# Patient Record
Sex: Female | Born: 1965 | Hispanic: Yes | Marital: Married | Smoking: Former smoker
Health system: Southern US, Community
[De-identification: ages and names within clinical notes are randomized; demographics above are authoritative.]

## PROBLEM LIST (undated history)

## (undated) DIAGNOSIS — K5790 Diverticulosis of intestine, part unspecified, without perforation or abscess without bleeding: Secondary | ICD-10-CM

## (undated) DIAGNOSIS — K219 Gastro-esophageal reflux disease without esophagitis: Secondary | ICD-10-CM

## (undated) DIAGNOSIS — J45909 Unspecified asthma, uncomplicated: Secondary | ICD-10-CM

## (undated) DIAGNOSIS — R945 Abnormal results of liver function studies: Secondary | ICD-10-CM

## (undated) DIAGNOSIS — R7989 Other specified abnormal findings of blood chemistry: Secondary | ICD-10-CM

## (undated) DIAGNOSIS — K649 Unspecified hemorrhoids: Secondary | ICD-10-CM

## (undated) DIAGNOSIS — F419 Anxiety disorder, unspecified: Secondary | ICD-10-CM

## (undated) DIAGNOSIS — N750 Cyst of Bartholin's gland: Secondary | ICD-10-CM

## (undated) DIAGNOSIS — E785 Hyperlipidemia, unspecified: Secondary | ICD-10-CM

## (undated) DIAGNOSIS — K635 Polyp of colon: Secondary | ICD-10-CM

## (undated) DIAGNOSIS — F988 Other specified behavioral and emotional disorders with onset usually occurring in childhood and adolescence: Secondary | ICD-10-CM

## (undated) DIAGNOSIS — I8390 Asymptomatic varicose veins of unspecified lower extremity: Secondary | ICD-10-CM

## (undated) DIAGNOSIS — N951 Menopausal and female climacteric states: Secondary | ICD-10-CM

## (undated) DIAGNOSIS — J302 Other seasonal allergic rhinitis: Secondary | ICD-10-CM

## (undated) DIAGNOSIS — E039 Hypothyroidism, unspecified: Secondary | ICD-10-CM

## (undated) DIAGNOSIS — J189 Pneumonia, unspecified organism: Secondary | ICD-10-CM

## (undated) HISTORY — DX: Unspecified hemorrhoids: K64.9

## (undated) HISTORY — DX: Other specified behavioral and emotional disorders with onset usually occurring in childhood and adolescence: F98.8

## (undated) HISTORY — PX: PELVIC LAPAROSCOPY: SHX162

## (undated) HISTORY — DX: Abnormal results of liver function studies: R94.5

## (undated) HISTORY — DX: Menopausal and female climacteric states: N95.1

## (undated) HISTORY — DX: Asymptomatic varicose veins of unspecified lower extremity: I83.90

## (undated) HISTORY — DX: Other specified abnormal findings of blood chemistry: R79.89

## (undated) HISTORY — DX: Hyperlipidemia, unspecified: E78.5

## (undated) HISTORY — DX: Hypothyroidism, unspecified: E03.9

## (undated) HISTORY — PX: OVARIAN CYST REMOVAL: SHX89

## (undated) HISTORY — DX: Polyp of colon: K63.5

## (undated) HISTORY — DX: Unspecified asthma, uncomplicated: J45.909

## (undated) HISTORY — DX: Diverticulosis of intestine, part unspecified, without perforation or abscess without bleeding: K57.90

## (undated) HISTORY — DX: Gastro-esophageal reflux disease without esophagitis: K21.9

## (undated) HISTORY — DX: Anxiety disorder, unspecified: F41.9

## (undated) HISTORY — DX: Cyst of Bartholin's gland: N75.0

---

## 1991-11-01 HISTORY — PX: VEIN SURGERY: SHX48

## 1993-10-31 HISTORY — PX: CHOLECYSTECTOMY: SHX55

## 1999-01-29 ENCOUNTER — Ambulatory Visit (HOSPITAL_COMMUNITY): Admission: RE | Admit: 1999-01-29 | Discharge: 1999-01-29 | Payer: Self-pay | Admitting: Internal Medicine

## 1999-01-29 ENCOUNTER — Encounter: Payer: Self-pay | Admitting: Internal Medicine

## 1999-02-08 ENCOUNTER — Other Ambulatory Visit: Admission: RE | Admit: 1999-02-08 | Discharge: 1999-02-08 | Payer: Self-pay | Admitting: *Deleted

## 1999-02-25 ENCOUNTER — Encounter: Payer: Self-pay | Admitting: Internal Medicine

## 1999-02-25 ENCOUNTER — Ambulatory Visit (HOSPITAL_COMMUNITY): Admission: RE | Admit: 1999-02-25 | Discharge: 1999-02-25 | Payer: Self-pay | Admitting: Internal Medicine

## 1999-12-31 ENCOUNTER — Encounter: Payer: Self-pay | Admitting: *Deleted

## 1999-12-31 ENCOUNTER — Encounter: Admission: RE | Admit: 1999-12-31 | Discharge: 1999-12-31 | Payer: Self-pay | Admitting: *Deleted

## 2000-02-08 ENCOUNTER — Other Ambulatory Visit: Admission: RE | Admit: 2000-02-08 | Discharge: 2000-02-08 | Payer: Self-pay | Admitting: *Deleted

## 2000-04-27 ENCOUNTER — Emergency Department (HOSPITAL_COMMUNITY): Admission: EM | Admit: 2000-04-27 | Discharge: 2000-04-27 | Payer: Self-pay | Admitting: Internal Medicine

## 2000-12-08 ENCOUNTER — Encounter (INDEPENDENT_AMBULATORY_CARE_PROVIDER_SITE_OTHER): Payer: Self-pay

## 2000-12-08 ENCOUNTER — Other Ambulatory Visit: Admission: RE | Admit: 2000-12-08 | Discharge: 2000-12-08 | Payer: Self-pay | Admitting: *Deleted

## 2001-06-11 ENCOUNTER — Other Ambulatory Visit: Admission: RE | Admit: 2001-06-11 | Discharge: 2001-06-11 | Payer: Self-pay | Admitting: *Deleted

## 2002-03-11 ENCOUNTER — Other Ambulatory Visit: Admission: RE | Admit: 2002-03-11 | Discharge: 2002-03-11 | Payer: Self-pay | Admitting: *Deleted

## 2003-04-29 ENCOUNTER — Other Ambulatory Visit: Admission: RE | Admit: 2003-04-29 | Discharge: 2003-04-29 | Payer: Self-pay | Admitting: *Deleted

## 2004-10-11 ENCOUNTER — Other Ambulatory Visit: Admission: RE | Admit: 2004-10-11 | Discharge: 2004-10-11 | Payer: Self-pay | Admitting: *Deleted

## 2006-03-08 ENCOUNTER — Other Ambulatory Visit: Admission: RE | Admit: 2006-03-08 | Discharge: 2006-03-08 | Payer: Self-pay | Admitting: *Deleted

## 2007-06-22 ENCOUNTER — Other Ambulatory Visit: Admission: RE | Admit: 2007-06-22 | Discharge: 2007-06-22 | Payer: Self-pay | Admitting: *Deleted

## 2008-07-24 ENCOUNTER — Ambulatory Visit: Payer: Self-pay | Admitting: Gynecology

## 2008-07-28 ENCOUNTER — Ambulatory Visit: Payer: Self-pay | Admitting: Gynecology

## 2009-03-03 ENCOUNTER — Other Ambulatory Visit: Admission: RE | Admit: 2009-03-03 | Discharge: 2009-03-03 | Payer: Self-pay | Admitting: Gynecology

## 2009-03-03 ENCOUNTER — Ambulatory Visit: Payer: Self-pay | Admitting: Gynecology

## 2009-03-03 ENCOUNTER — Encounter: Payer: Self-pay | Admitting: Gynecology

## 2009-03-10 ENCOUNTER — Ambulatory Visit: Payer: Self-pay | Admitting: Gynecology

## 2009-04-14 ENCOUNTER — Ambulatory Visit: Payer: Self-pay | Admitting: Gynecology

## 2009-04-17 ENCOUNTER — Ambulatory Visit: Payer: Self-pay | Admitting: Gynecology

## 2009-05-05 ENCOUNTER — Ambulatory Visit: Payer: Self-pay | Admitting: Gynecology

## 2009-10-21 ENCOUNTER — Encounter: Admission: RE | Admit: 2009-10-21 | Discharge: 2009-10-21 | Payer: Self-pay | Admitting: Gynecology

## 2010-08-20 ENCOUNTER — Ambulatory Visit: Payer: Self-pay | Admitting: Gynecology

## 2010-08-20 ENCOUNTER — Other Ambulatory Visit: Admission: RE | Admit: 2010-08-20 | Discharge: 2010-08-20 | Payer: Self-pay | Admitting: Gynecology

## 2010-08-24 ENCOUNTER — Ambulatory Visit: Payer: Self-pay | Admitting: Gynecology

## 2010-11-20 ENCOUNTER — Other Ambulatory Visit: Payer: Self-pay | Admitting: Gynecology

## 2010-11-20 DIAGNOSIS — Z0000 Encounter for general adult medical examination without abnormal findings: Secondary | ICD-10-CM

## 2010-12-02 ENCOUNTER — Ambulatory Visit
Admission: RE | Admit: 2010-12-02 | Discharge: 2010-12-02 | Disposition: A | Discharge status: Home / Self Care | Payer: BC Managed Care – PPO | Source: Ambulatory Visit | Attending: Gynecology | Admitting: Gynecology

## 2010-12-02 DIAGNOSIS — Z0000 Encounter for general adult medical examination without abnormal findings: Secondary | ICD-10-CM

## 2010-12-29 ENCOUNTER — Ambulatory Visit (INDEPENDENT_AMBULATORY_CARE_PROVIDER_SITE_OTHER): Payer: BC Managed Care – PPO | Admitting: Gynecology

## 2010-12-29 DIAGNOSIS — N750 Cyst of Bartholin's gland: Secondary | ICD-10-CM

## 2011-01-05 ENCOUNTER — Ambulatory Visit: Payer: BC Managed Care – PPO | Admitting: Gynecology

## 2011-01-07 ENCOUNTER — Ambulatory Visit: Payer: BC Managed Care – PPO | Admitting: Gynecology

## 2011-01-19 ENCOUNTER — Ambulatory Visit: Payer: BC Managed Care – PPO | Admitting: Gynecology

## 2011-01-26 ENCOUNTER — Ambulatory Visit (INDEPENDENT_AMBULATORY_CARE_PROVIDER_SITE_OTHER): Payer: BC Managed Care – PPO | Admitting: Gynecology

## 2011-01-26 DIAGNOSIS — N750 Cyst of Bartholin's gland: Secondary | ICD-10-CM

## 2011-01-26 DIAGNOSIS — N920 Excessive and frequent menstruation with regular cycle: Secondary | ICD-10-CM

## 2011-01-26 DIAGNOSIS — E7800 Pure hypercholesterolemia, unspecified: Secondary | ICD-10-CM

## 2012-06-06 ENCOUNTER — Other Ambulatory Visit: Payer: Self-pay | Admitting: Gynecology

## 2012-06-06 DIAGNOSIS — Z1231 Encounter for screening mammogram for malignant neoplasm of breast: Secondary | ICD-10-CM

## 2012-06-19 ENCOUNTER — Ambulatory Visit
Admission: RE | Admit: 2012-06-19 | Discharge: 2012-06-19 | Disposition: A | Discharge status: Home / Self Care | Payer: BC Managed Care – PPO | Source: Ambulatory Visit | Attending: Gynecology | Admitting: Gynecology

## 2012-06-19 DIAGNOSIS — Z1231 Encounter for screening mammogram for malignant neoplasm of breast: Secondary | ICD-10-CM

## 2013-12-09 ENCOUNTER — Encounter: Payer: Self-pay | Admitting: Gastroenterology

## 2014-01-07 ENCOUNTER — Encounter: Payer: BC Managed Care – PPO | Admitting: Gastroenterology

## 2014-01-30 ENCOUNTER — Encounter: Payer: Self-pay | Admitting: Family

## 2015-02-27 ENCOUNTER — Ambulatory Visit (INDEPENDENT_AMBULATORY_CARE_PROVIDER_SITE_OTHER): Payer: BLUE CROSS/BLUE SHIELD | Admitting: Gynecology

## 2015-02-27 ENCOUNTER — Encounter: Payer: Self-pay | Admitting: Gynecology

## 2015-02-27 ENCOUNTER — Other Ambulatory Visit (HOSPITAL_COMMUNITY)
Admission: RE | Admit: 2015-02-27 | Discharge: 2015-02-27 | Disposition: A | Discharge status: Home / Self Care | Payer: BLUE CROSS/BLUE SHIELD | Source: Ambulatory Visit | Attending: Gynecology | Admitting: Gynecology

## 2015-02-27 VITALS — BP 126/78 | Ht 65.0 in | Wt 156.0 lb

## 2015-02-27 DIAGNOSIS — Z01419 Encounter for gynecological examination (general) (routine) without abnormal findings: Secondary | ICD-10-CM | POA: Insufficient documentation

## 2015-02-27 DIAGNOSIS — Z1151 Encounter for screening for human papillomavirus (HPV): Secondary | ICD-10-CM | POA: Diagnosis present

## 2015-02-27 DIAGNOSIS — N750 Cyst of Bartholin's gland: Secondary | ICD-10-CM | POA: Diagnosis not present

## 2015-02-27 DIAGNOSIS — Z1159 Encounter for screening for other viral diseases: Secondary | ICD-10-CM

## 2015-02-27 DIAGNOSIS — N9089 Other specified noninflammatory disorders of vulva and perineum: Secondary | ICD-10-CM

## 2015-02-27 DIAGNOSIS — N951 Menopausal and female climacteric states: Secondary | ICD-10-CM | POA: Diagnosis not present

## 2015-02-27 LAB — CBC WITH DIFFERENTIAL/PLATELET
BASOS ABS: 0.1 10*3/uL (ref 0.0–0.1)
Basophils Relative: 1 % (ref 0–1)
Eosinophils Absolute: 0.8 10*3/uL — ABNORMAL HIGH (ref 0.0–0.7)
Eosinophils Relative: 10 % — ABNORMAL HIGH (ref 0–5)
HCT: 43.3 % (ref 36.0–46.0)
HEMOGLOBIN: 14.4 g/dL (ref 12.0–15.0)
Lymphocytes Relative: 19 % (ref 12–46)
Lymphs Abs: 1.5 10*3/uL (ref 0.7–4.0)
MCH: 30.4 pg (ref 26.0–34.0)
MCHC: 33.3 g/dL (ref 30.0–36.0)
MCV: 91.5 fL (ref 78.0–100.0)
MONOS PCT: 9 % (ref 3–12)
MPV: 9 fL (ref 8.6–12.4)
Monocytes Absolute: 0.7 10*3/uL (ref 0.1–1.0)
NEUTROS ABS: 4.8 10*3/uL (ref 1.7–7.7)
NEUTROS PCT: 61 % (ref 43–77)
PLATELETS: 310 10*3/uL (ref 150–400)
RBC: 4.73 MIL/uL (ref 3.87–5.11)
RDW: 14.3 % (ref 11.5–15.5)
WBC: 7.8 10*3/uL (ref 4.0–10.5)

## 2015-02-27 LAB — LIPID PANEL
CHOL/HDL RATIO: 4.4 ratio
Cholesterol: 274 mg/dL — ABNORMAL HIGH (ref 0–200)
HDL: 62 mg/dL (ref >=46)
LDL Cholesterol: 167 mg/dL — ABNORMAL HIGH (ref 0–99)
TRIGLYCERIDES: 223 mg/dL — AB (ref <150)
VLDL: 45 mg/dL — AB (ref 0–40)

## 2015-02-27 LAB — COMPREHENSIVE METABOLIC PANEL
ALBUMIN: 4.1 g/dL (ref 3.5–5.2)
ALT: 37 U/L — AB (ref 0–35)
AST: 33 U/L (ref 0–37)
Alkaline Phosphatase: 110 U/L (ref 39–117)
BUN: 10 mg/dL (ref 6–23)
CO2: 22 meq/L (ref 19–32)
Calcium: 9.8 mg/dL (ref 8.4–10.5)
Chloride: 106 mEq/L (ref 96–112)
Creat: 0.72 mg/dL (ref 0.50–1.10)
GLUCOSE: 81 mg/dL (ref 70–99)
Potassium: 4.9 mEq/L (ref 3.5–5.3)
SODIUM: 138 meq/L (ref 135–145)
Total Bilirubin: 0.5 mg/dL (ref 0.2–1.2)
Total Protein: 7.6 g/dL (ref 6.0–8.3)

## 2015-02-27 LAB — TSH: TSH: 6.409 u[IU]/mL — ABNORMAL HIGH (ref 0.350–4.500)

## 2015-02-27 NOTE — Progress Notes (Signed)
Denise Benitez [birth year] [id number]   History:    49 y.o.  for annual gyn exam who is new to the practice. Patient has not had a gynecological exam in over 5 years. Patient complaining of a bulging sensation in her vagina. Patient reports many years ago she had an incision and drainage of a right Bartholin duct cyst. Patient's history also indicated that many years ago she had laparoscopic ovarian cystectomy and she reports the pathology report was benign. She also has had previous tubal sterilization procedure. She is reporting now that her cycles are skipping and is accompanied by hot flashes, irritability, mood swing and insomnia. Patient denied any prior history of abnormal Pap smear.  Past medical history,surgical history, family history and social history were all reviewed and documented in the EPIC chart.  Gynecologic History Patient's last menstrual period was 01/23/2015. Contraception: tubal ligation Last Pap: Over 5 years ago. Results were: normal Last mammogram: 2013. Results were: Normal but dense  Obstetric History OB History  Gravida Para Term Preterm AB SAB TAB Ectopic Multiple Living  2 2        2     # Outcome Date GA Lbr Len/2nd Weight Sex Delivery Anes PTL Lv  2 Para           1 Para                ROS: A ROS was performed and pertinent positives and negatives are included in the history.  GENERAL: No fevers or chills. HEENT: No change in vision, no earache, sore throat or sinus congestion. NECK: No pain or stiffness. CARDIOVASCULAR: No chest pain or pressure. No palpitations. PULMONARY: No shortness of breath, cough or wheeze. GASTROINTESTINAL: No abdominal pain, nausea, vomiting or diarrhea, melena or bright red blood per rectum. GENITOURINARY: Bulging sensation right side of vagina. MUSCULOSKELETAL: No joint or muscle pain, no back pain, no recent trauma. DERMATOLOGIC: No rash, no itching, no lesions. ENDOCRINE: No polyuria, polydipsia, no heat or cold  intolerance. No recent change in weight. HEMATOLOGICAL: No anemia or easy bruising or bleeding. NEUROLOGIC: No headache, seizures, numbness, tingling or weakness. PSYCHIATRIC: No depression, no loss of interest in normal activity or change in sleep pattern.     Exam: chaperone present  BP 126/78 mmHg  Ht 5\' 5"  (1.651 m)  Wt 156 lb (70.761 kg)  BMI 25.96 kg/m2  LMP 01/23/2015  Body mass index is 25.96 kg/(m^2).  General appearance : Well developed well nourished female. No acute distress HEENT: Eyes: no retinal hemorrhage or exudates,  Neck supple, trachea midline, no carotid bruits, no thyroidmegaly Lungs: Clear to auscultation, no rhonchi or wheezes, or rib retractions  Heart: Regular rate and rhythm, no murmurs or gallops Breast:Examined in sitting and supine position were symmetrical in appearance, no palpable masses or tenderness,  no skin retraction, no nipple inversion, no nipple discharge, no skin discoloration, no axillary or supraclavicular lymphadenopathy Abdomen: no palpable masses or tenderness, no rebound or guarding Extremities: no edema or skin discoloration or tenderness  Pelvic:  Bartholin, Urethra, Skene Glands: Within normal limits             Vagina: Right Bartholin cyst  Cervix: No gross lesions or discharge  Uterus  anteverted, normal size, shape and consistency, non-tender and mobile  Adnexa  Without masses or tenderness  Anus and perineum  normal   Rectovaginal  normal sphincter tone without palpated masses or tenderness  Hemoccult not indicated     Assessment/Plan:  49 y.o. female for annual exam who appears to be perimenopausal and for this reason we will check her [facility] along with the following screening labs in the fasting state: Fasting lipid profile, comprehensive metabolic panel, TSH, CBC, and urinalysis. Pap smear with HPV screening was done today. Patient will return back to the office next week to discuss the results as well as plan of  incision and drainage of Bartholin duct cyst. Meanwhile she will do daily sitz baths. She was provided with a requisition to schedule her mammogram and to request three-dimensional due to the fact that her last mammogram in 2013 had described that her breasts were dense. We also discussed importance of calcium and vitamin D and regular exercise for osteoporosis prevention. Patient also will be reminded that she needs a screening colonoscopy.  New CDC guidelines is recommending patients be tested once in her lifetime for hepatitis C antibody who were born between [birth year] through 1967. This was discussed with the patient today and has agreed to be tested today.   Terrance Mass MD, 10:39 AM 02/27/2015

## 2015-02-27 NOTE — Patient Instructions (Addendum)
Terapia de reemplazo hormonal (Hormone Replacement Therapy) En la menopausia, su cuerpo comienza a producir menos estrgeno y Immunologist. Esto provoca que el cuerpo deje de tener perodos [place]. Esto se debe a que el estrgeno y la progesterona controlan sus perodos y su ciclo menstrual. Denise Benitez falta de estrgeno puede causar sntomas tales como:  Social research officer, government.  Sequedad vaginal  Piel seca.  Prdida del deseo sexual.  Riesgo de prdida de hueso (osteoporosis). Cuando esto ocurre, puede elegir realizar Denise Benitez terapia hormonal para volver a Clinical research associate estrgeno perdido [employer]. Cuando slo se introduce esta hormona, el procedimiento se conoce normalmente como TRE (terapia de reemplazo de [place]). Cuando la hormona progestina se combina con el estrgeno, el procedimiento se conoce normalmente como TH (terapia hormonal). Esto es lo que previamente se conoca como terapia de reemplazo hormonal (TRH). El profesional que le asiste le ayudar a tomar una decisin acerca de lo que resulte lo mejor para usted. La decisin de realizar una TRH cambia a menudo debido a que se Risk manager. Muchos estudios no ponen de acuerdo con respecto a los beneficios de Optometrist una terapia de reemplazo hormonal.  BENEFICIOS PROBABLES DE LA TRH QUE INCLUYEN PROTECCIN CONTRA:  Golpes de calor - Un golpe de calor es la sensacin repentina de calor sobre la cara y el cuerpo. La piel enrojece, como al sonrojarse. Estn asociados con la transpiracin y los trastornos del sueo. Las mujeres que atraviesan la menopausia pueden tener golpes de calor unas pocas veces en el mes o varios al da; esto depende de la mujer.  Osteoporosis (prdida de hueso) - El estrgeno ayuda a protegerse contra la prdida de [place]. Luego de la [place], los huesos de una mujer pierden calcio y se vuelven frgiles y Publishing rights manager. Como resultado, es ms probable que el hueso se [place]. Los que resultan afectados con  mayor frecuencia son los de la cadera, la [place] y la columna vertebral. La terapia hormonal puede ayudar a retardar la prdida de hueso luego de la menopausia. Realizar ejercicios con peso y tomar calcio con vitamina D tambin puede ayudar a prevenir la prdida de [place]. Existen medicamentos que puede prescribir el profesional que la asiste para ayudar a prevenir la osteoporosis.  Sequedad vaginal - La prdida de estrgeno produce cambios en la vagina. El recubrimiento de la misma puede volverse fino y Education officer, museum. Estos cambios pueden causar dolor y [place]. La sequedad tambin puede producir una infeccin. Puede ocasionarle ardor y [place].  Las infecciones en las vas urinarias son ms comunes luego de la menopausia debido a la falta de Oceanographer.  Otros beneficios posibles del estrgeno incluyen un cambio positivo en el humor y en la memoria de corto plazo en las mujeres. EFECTOS SECUNDARIOS Y RIESGOS  Utilizar estrgeno slo sin progesterona causa que el recubrimiento del tero crezca. Esto aumenta el riesgo de cncer endometrial. El profesional que la asiste deber darle otra hormona llamada progestina, si usted tiene tero.  Las mujeres que realizan una TH combinada (estrgeno y progestina) parecen tener un mayor riesgo de sufrir cncer de mama. El riesgo parece ser [place], [place] aumenta a lo largo del tiempo que se realice la [place].  La terapia combinada tambin hace que el tejido mamario sea levemente ms denso, lo que hace que sea ms difcil leer mamografas (radiografas de mama).  Combinada, la terapia de estrgeno y Immunologist puede realizarse todos los das, en cuyo caso podrn Warehouse manager de [place]. La TH puede realizarse  de manera cclica, en cuyo caso tendr perodos menstruales.  La TH puede aumentar el riesgo de [place], ataque cardaco, cncer de mama y formacin de cogulos en la pierna. TRATAMIENTO  Si decide realizar Denise Benitez TH y tiene tero,  normalmente se prescribe el uso de estrgeno y progestina.  El profesional que la asiste le ayudar a decidir la mejor forma de [employer].  Lo mejor es Chief Executive Officer dosis posible que pueda ayudarla con sus sntomas y tomarlos durante la menor cantidad de tiempo posible.  La terapia hormonal puede ayudar a Banker de los problemas (sntomas) que afectan a las mujeres durante la menopausia. Antes de tomar una decisin con respecto a la TH, converse con el profesional que la asiste acerca de qu es lo mejor para usted. Mantngase bien informada y sintase cmoda con sus decisiones. INSTRUCCIONES PARA EL CUIDADO DOMICILIARIO:  [place] indicaciones del profesional con respecto a cmo Biomedical scientist.  Hgase controles de [place] regular, e incluya [place] y [place]. SOLICITE ATENCIN MDICA DE INMEDIATO SI PRESENTA:  Hemorragia vaginal anormal.  Dolor o inflamacin en las piernas, falta de aliento o Tourist information centre manager.  Mareos o dolores de [place].  Protuberancias o cambios en sus mamas o axilas.  Pronunciacin inarticulada.  Debilidad o adormecimiento en los brazos o las piernas.  Dolor, ardor o sangrado al [employer].  Dolor abdominal. Document Released: 04/04/2008 Document Revised: 01/09/2012 ExitCare Patient Information 2015 [place], [place]. This information is not intended to replace advice given to you by your health care provider. Make sure you discuss any questions you have with your health care provider. Perimenopausia (Perimenopause) La perimenopausia es el momento en que su cuerpo comienza a pasar a la menopausia (sin menstruacin durante 12 meses consecutivos). Es un proceso natural. La perimenopausia puede comenzar entre 2 y 90 aos antes de la menopausia y por lo general tiene una duracin de 1 ao ms pasada la menopausia. [employer], los ovarios podran producir un vulo o no. Los ovarios varan su produccin de las hormonas  estrgeno y Technical brewer. Esto puede causar perodos menstruales irregulares, dificultad para quedar embarazada, hemorragia vaginal entre perodos y sntomas incmodos. CAUSAS  Produccin irregular de las hormonas ovricas estrgeno y Immunologist, y no ovular todos los meses.  Otras causas son:  Tumor de la glndula pituitaria.  Enfermedades que [employer] ovarios.  Radioterapia.  Quimioterapia.  Causas desconocidas.  Fumar mucho y abusar del consumo de alcohol puede llevar a que la perimenopausia aparezca antes. SIGNOS Y SNTOMAS   Acaloramiento.  Sudoracin nocturna.  Perodos menstruales irregulares.  Disminucin del deseo sexual.  Sequedad vaginal.  Dolores de cabeza.  Cambios en el estado de nimo.  Depresin.  Problemas de memoria.  Irritabilidad.  Cansancio.  Aumento de [place].  Problemas para quedar embarazada.  Prdida de clulas seas (osteoporosis).  Comienzo de endurecimiento de las arterias (aterosclerosis). DIAGNSTICO  El mdico realizar un diagnstico en funcin de su edad, historial de perodos menstruales y sntomas. Le realizarn un examen fsico para ver si hay algn cambio en su cuerpo, en especial en sus rganos reproductores. Las pruebas hormonales pueden ser o no tiles segn la cantidad de hormonas femeninas que produzca y [employer] produzca. Sin embargo, podrn Microbiologist pruebas hormonales para Statistician. TRATAMIENTO  En algunos casos, no se necesita tratamiento. La decisin acerca de qu tratamiento es necesario durante la perimenopausia deber realizarse en conjunto con su mdico segn cmo estn afectando los sntomas  a su estilo de vida. Existen varios tratamientos disponibles, como:  Risk manager cada sntoma individual con medicamentos especficos para ese sntoma.  Algunos medicamentos herbales pueden ayudar en sntomas especficos.  Psicoterapia.  Terapia grupal. INSTRUCCIONES PARA EL CUIDADO EN  EL HOGAR   Controle sus periodos menstruales (cundo ocurren, qu tan abundantes son, cunto tiempo pasa entre perodos, y cunto duran) como tambin sus sntomas y cundo comenzaron.  Tome slo medicamentos de venta libre o recetados, segn las indicaciones del mdico.  Duerma y descanse.  Haga actividad fsica.  Consuma una dieta que contenga calcio (bueno para los [place]) y productos derivados de la soja (actan como estrgenos).  No fume.  Evite las bebidas alcohlicas.  Tome los suplementos vitamnicos segn las indicaciones del mdico. En ciertos casos, puede ser de [place] tomar vitamina E.  Tome suplementos de calcio y vitamina D para ayudar a Publishing rights manager prdida sea.  En algunos casos la terapia de grupo podr ayudarla.  La acupuntura puede ser de ayuda en ciertos casos. SOLICITE ATENCIN MDICA SI:   Tiene preguntas acerca de sus sntomas.  Necesita ser derivada a un especialista (gineclogo, psiquiatra, o psiclogo). SOLICITE ATENCIN MDICA DE INMEDIATO SI:   Sufre una hemorragia vaginal abundante.  Su perodo menstrual dura ms de 8 das.  Sus perodos son recurrentes cada menos de [address]  Tiene hemorragias durante las [employer].  Est muy deprimido.  Siente dolor al [employer].  Siente dolor de cabeza intenso.  Tiene problemas de visin. Document Released: 10/17/2005 Document Revised: 08/07/2013 [facility] Patient Information 2015 [place], [place]. This information is not intended to replace advice given to you by your health care provider. Make sure you discuss any questions you have with your health care provider. Absceso o quiste de Bartolino (Bartholin's Cyst or Abscess) Las glndulas de Bartolino son glndulas pequeas ubicadas dentro de los pliegues de la piel (labios) a los lados de la apertura de la vagina (canal del parto). Cuando el conducto de la glndula se [place], puede desarrollarse un quiste. Cuando esto ocurre, el lquido que se  acumula dentro del quiste puede llegar a infectarse. Esto se conoce como absceso. La glndula de Bartolino produce una mucosidad lquida en la parte externa de la vagina durante las relaciones sexuales. SNTOMAS  Los [employer] presentan un quiste pequeo no tienen problemas.  Podr sentir desde una leve molestia a un dolor intenso segn el tamao del quiste y si existe infeccin o no,  Education officer, environmental, inflamacin e hinchazn en la zona inferior de la vagina.  Dolor en [place].  Presin en las zona del perineo.  Hinchazn de los labios de la vagina.  El quiste puede estar en uno o ambos lados de la vagina. DIAGNSTICO  El profesional podr observar una gran zona hinchada en la parte inferior de la vagina.  Es una zona dolorosa al tacto.  Si se trata de un absceso habr inflamacin y Social research officer, government. TRATAMIENTO  En algunos casos el quiste desaparecer sin tratamiento.  Aplique compresas tibias hmedas en la zona o tome baos de asiento varias veces al da.  Le practicarn una incisin para drenar el quiste o el absceso, previa aplicacin de anestesia local.  Si se trata de un absceso le indicarn un cultivo del pus.  Y en ese caso le prescribirn un tratamiento con antibiticos.  Se realizar una abertura en la glndula, suturando los bordes para hacer la abertura ms grande ([place]).  Si aparece nuevamente el quiste o el absceso, le extirparn  toda la glndula. PREVENCIN  Mantenga una buena higiene.  Higienice la zona vaginal con jabn neutro y un pao suave.  No frote la zona al darse un bao.  Proteja la zona de la entrepierna con un apsito si realiza largos paseos en bicicleta o a caballo.  Asegrese de estar bien lubricada cuando mantenga relaciones sexuales. INSTRUCCIONES PARA EL CUIDADO DOMICILIARIO  Si su quiste o absceso ha sido abierto, pudieran haberle colocado un pequeo trozo de gasa o un drenaje para permitir que la herida supure.  La gasa o el drenaje a menos que se lo indique el profesional que le asiste.  Use toallas femeninas y no tampones cuando lo necesite en caso de drenaje o sangrado.  Si le han recetado medicamentos que combaten los grmenes (antibiticos ), tmelos exactamente de la manera que le haya sido indicada. Asegrese de terminar con todo el ciclo de antibiticos.  Utilice los medicamentos de venta libre o de prescripcin para Conservation officer, historic buildings, Health and safety inspector o la [place], segn se lo indique el profesional que lo asiste. SOLICITE ATENCIN MDICA DE INMEDIATO SI:  Aumenta el dolor, el enrojecimiento, la hinchazn o la supuracin.  La herida ha sangrado al punto que ha debido usar ms apsitos de los que la cantidad de apsitos sugerida por el mdico en 24 horas.  Siente escalofros.  Tiene fiebre.  Tiene algn problema (sntoma) nuevo o se agravan lo ya existentes. EST SEGURO QUE:  Comprende las instrucciones para el alta mdica.  Controlar su enfermedad.  Solicitar atencin mdica de inmediato segn las indicaciones. Document Released: 10/17/2005 Document Revised: 01/09/2012 [facility] Patient Information 2015 [place]. This information is not intended to replace advice given to you by your health care provider. Make sure you discuss any questions you have with your health care provider.

## 2015-02-27 NOTE — Addendum Note (Signed)
Addended by: Nelva Nay on: 02/27/2015 10:59 AM   Modules accepted: Orders

## 2015-02-28 LAB — URINALYSIS W MICROSCOPIC + REFLEX CULTURE
Bacteria, UA: NONE SEEN
Crystals: NONE SEEN
GLUCOSE, UA: NEGATIVE mg/dL
Hgb urine dipstick: NEGATIVE
LEUKOCYTES UA: NEGATIVE
Nitrite: NEGATIVE
PROTEIN: 30 mg/dL — AB
Specific Gravity, Urine: 1.022 (ref 1.005–1.030)
Urobilinogen, UA: 0.2 mg/dL (ref 0.0–1.0)
pH: 5.5 (ref 5.0–8.0)

## 2015-02-28 LAB — HEPATITIS C ANTIBODY: HCV Ab: NEGATIVE

## 2015-02-28 LAB — FOLLICLE STIMULATING HORMONE: FSH: 85.9 m[IU]/mL

## 2015-03-02 ENCOUNTER — Encounter: Payer: Self-pay | Admitting: Gynecology

## 2015-03-02 LAB — CYTOLOGY - PAP

## 2015-03-03 ENCOUNTER — Other Ambulatory Visit: Payer: Self-pay

## 2015-03-03 ENCOUNTER — Telehealth: Payer: Self-pay | Admitting: *Deleted

## 2015-03-03 ENCOUNTER — Ambulatory Visit (INDEPENDENT_AMBULATORY_CARE_PROVIDER_SITE_OTHER): Payer: BLUE CROSS/BLUE SHIELD | Admitting: Gynecology

## 2015-03-03 ENCOUNTER — Encounter: Payer: Self-pay | Admitting: Gynecology

## 2015-03-03 VITALS — BP 124/78

## 2015-03-03 DIAGNOSIS — D721 Eosinophilia, unspecified: Secondary | ICD-10-CM

## 2015-03-03 DIAGNOSIS — E785 Hyperlipidemia, unspecified: Secondary | ICD-10-CM | POA: Diagnosis not present

## 2015-03-03 DIAGNOSIS — L720 Epidermal cyst: Secondary | ICD-10-CM

## 2015-03-03 DIAGNOSIS — E039 Hypothyroidism, unspecified: Secondary | ICD-10-CM | POA: Diagnosis not present

## 2015-03-03 DIAGNOSIS — N750 Cyst of Bartholin's gland: Secondary | ICD-10-CM | POA: Diagnosis not present

## 2015-03-03 DIAGNOSIS — Z780 Asymptomatic menopausal state: Secondary | ICD-10-CM

## 2015-03-03 DIAGNOSIS — R945 Abnormal results of liver function studies: Secondary | ICD-10-CM

## 2015-03-03 DIAGNOSIS — R7989 Other specified abnormal findings of blood chemistry: Secondary | ICD-10-CM

## 2015-03-03 DIAGNOSIS — Z1231 Encounter for screening mammogram for malignant neoplasm of breast: Secondary | ICD-10-CM

## 2015-03-03 LAB — CBC WITH DIFFERENTIAL/PLATELET
Basophils Absolute: 0.2 10*3/uL — ABNORMAL HIGH (ref 0.0–0.1)
Basophils Relative: 2 % — ABNORMAL HIGH (ref 0–1)
EOS ABS: 0.7 10*3/uL (ref 0.0–0.7)
Eosinophils Relative: 9 % — ABNORMAL HIGH (ref 0–5)
HEMATOCRIT: 42.1 % (ref 36.0–46.0)
Hemoglobin: 14 g/dL (ref 12.0–15.0)
LYMPHS ABS: 1.7 10*3/uL (ref 0.7–4.0)
Lymphocytes Relative: 21 % (ref 12–46)
MCH: 30.4 pg (ref 26.0–34.0)
MCHC: 33.3 g/dL (ref 30.0–36.0)
MCV: 91.5 fL (ref 78.0–100.0)
MPV: 9.4 fL (ref 8.6–12.4)
Monocytes Absolute: 0.6 10*3/uL (ref 0.1–1.0)
Monocytes Relative: 8 % (ref 3–12)
NEUTROS PCT: 60 % (ref 43–77)
Neutro Abs: 4.7 10*3/uL (ref 1.7–7.7)
Platelets: 258 10*3/uL (ref 150–400)
RBC: 4.6 MIL/uL (ref 3.87–5.11)
RDW: 14.5 % (ref 11.5–15.5)
WBC: 7.9 10*3/uL (ref 4.0–10.5)

## 2015-03-03 LAB — THYROID PANEL WITH TSH
FREE THYROXINE INDEX: 1.4 (ref 1.4–3.8)
T3 Uptake: 30 % (ref 22–35)
T4, Total: 4.7 ug/dL (ref 4.5–12.0)
TSH: 5.44 u[IU]/mL — ABNORMAL HIGH (ref 0.350–4.500)

## 2015-03-03 LAB — AST: AST: 30 U/L (ref 0–37)

## 2015-03-03 LAB — ALT: ALT: 39 U/L — AB (ref 0–35)

## 2015-03-03 MED ORDER — ZOLPIDEM TARTRATE 10 MG PO TABS
10.0000 mg | ORAL_TABLET | Freq: Every evening | ORAL | Status: DC | PRN
Start: 2015-03-03 — End: 2015-07-21

## 2015-03-03 MED ORDER — KETOROLAC TROMETHAMINE 10 MG PO TABS
10.0000 mg | ORAL_TABLET | Freq: Four times a day (QID) | ORAL | Status: DC | PRN
Start: 2015-03-03 — End: 2015-07-08

## 2015-03-03 MED ORDER — IBUPROFEN 800 MG PO TABS: 800.0000 mg | ORAL_TABLET | Freq: Three times a day (TID) | ORAL | Status: DC

## 2015-03-03 NOTE — Telephone Encounter (Signed)
Call in Ultram 50 mg one by mouth every 6 hours when necessary #30

## 2015-03-03 NOTE — Progress Notes (Addendum)
   Patient presented to the office today for incision and drainage of a Bartholin duct abscess. Patient had also complained of a mons pubic lesion which appears to be an epidermal inclusion cyst. Patient many years ago had a Bartholin duct cyst on the same side. Patient was in the office within the past week for her annual exam and had blood work drawn which we discussed today as well along with some were abnormal as follows:  Her TSH was found to be elevated at 6.409 [facility] was found to be elevated 85.9 Her lipid profile: Total cholesterol 274, triglycerides elevated to 223 LDL elevated at 167 CBC: Slightly elevated eosinophil Elevated SGPT   Exam: Right Bartholin duct inflammation/abscess Mons pubis abdominal inclusion cyst  Procedure: The area of the right Bartholin gland was cleansed with Betadine solution and 1% lidocaine was infiltrated at the site of the maximum distention. A small stab incision was made with a scalpel a mucoid material extruded and a culture was obtained. The loculations were broken down with a curved hemostat and the area was copiously irrigated with normal saline. A Ward catheter was placed. The small epidermal inclusion cyst at the mons pubis area was cleansed with Betadine solution and a small incision was made with a scalpel and was drained as well. For hemostasis overnight gel was applied  Assessment/plan: #1 status post I&D of Bartholin duct cyst culture pending Ward catheter placed #2 the following labs were repeated today for confirmation of diagnosis: Full thyroid panel along with repeat CBC and SGOT and SGPT #3 patient will return back to the office next week to discuss these results. Cholesterol lowering diet handout was provided. We'll discuss starting her on statin as a referral to internist next week. Patient prefers not to be on any hormone replacement therapy but we will need to monitor her hypothyroidism if indeed it is confirmed. Literature information was  provided on the subject.

## 2015-03-03 NOTE — Telephone Encounter (Signed)
Dr.Fernandez when I was going to e-scibe Rx it said pt has allergy/contraindication to codeine the reaction was itching. Can pt have other pain Rx?

## 2015-03-03 NOTE — Telephone Encounter (Signed)
Pharmacy called stating that they will be unable to dispense Rx for Toradol 10 mg because she didn't receive shot in office. Pharmacist states if pill form of Toradol is dispense a IM injection must be done first. Pt will need alternate medication. Please advise

## 2015-03-03 NOTE — Telephone Encounter (Signed)
Pt informed, rx sent 

## 2015-03-03 NOTE — Telephone Encounter (Signed)
Left message for pt to call.

## 2015-03-03 NOTE — Telephone Encounter (Signed)
Call in prescription for Motrin 800 mg 1 by mouth 3 times a day when necessary #30

## 2015-03-03 NOTE — Patient Instructions (Addendum)
Control del colesterol  Los niveles de colesterol en el organismo estn determinados significativamente por su dieta. Los niveles de colesterol tambin se relacionan con la enfermedad cardaca. El material que sigue ayuda a explicar esta relacin y a analizar qu puede hacer para mantener su corazn sano. No todo el colesterol es malo. Las lipoprotenas de baja densidad (LDL) forman el colesterol "malo". El colesterol malo puede ocasionar depsitos de grasa que se acumulan en el interior de las arterias. Las lipoprotenas de alta densidad (HDL) es el colesterol "bueno". Ayuda a remover el colesterol LDL "malo" de la sangre. El colesterol es un factor de riesgo muy importante para la enfermedad cardaca. Otros factores de riesgo son la hipertensin arterial, el hbito de fumar, el estrs, la herencia y el peso.   El msculo cardaco obtiene el suministro de sangre a travs de las arterias coronarias. Si su colesterol LDL ("malo") est elevado y el HDL ("bueno") es bajo, tiene un factor de riesgo para que se formen depsitos de grasa en las arterias coronarias (los vasos sanguneos que suministran sangre al corazn). Esto hace que haya menos lugar para que la sangre circule. Sin la suficiente sangre y oxgeno, el msculo cardaco no puede funcionar correctamente, y usted podr sentir dolores en el pecho (angina pectoris). Cuando una arteria coronaria se cierra completamente, una parte del msculo cardaco puede morir (infarto de miocardio).  CONTROL DEL COLESTEROL Cuando el profesional que lo asiste enva la sangre al laboratorio para conocer el nivel de colesterol, puede realizarle tambin un perfil completo de los lpidos. Con esta prueba, se puede determinar la cantidad total de colesterol, as como los niveles de LDL y HDL. Los triglicridos son un tipo de grasa que circula en la sangre y que tambin puede utilizarse  para determinar el riesgo de enfermedad cardaca. En la siguiente tabla se establecen los nmeros ideales: Prueba: Colesterol total  Menos de 200 mg/dl.  Prueba: LDL "colesterol malo"  Menos de 100 mg/dl.   Menos de 70 mg/dl si tiene riesgo muy elevado de sufrir un ataque cardaco o muerte cardaca sbita.  Prueba: HDL "colesterol bueno"  Mujeres: Ms de 50 mg/dl.   Hombres: Ms de 40 mg/dl.  Prueba: Trigliceridos  Menos de 150 mg/dl.    CONTROL DEL COLESTEROL CON DIETA Aunque factores como el ejercicio y el estilo de vida son importantes, la "primera lnea de ataque" es la dieta. Esto se debe a que se sabe que ciertos alimentos hacen subir el colesterol y otros lo bajan. El objetivo debe ser equilibrar los alimentos, de modo que tengan un efecto sobre el colesterol y, an ms importante, reemplazar las grasas saturadas y trans con otros tipos de grasas, como las monoinsaturadas y las poliinsaturadas y cidos grasos omega-3 . En promedio, una persona no debe consumir ms de 15 a 17 g de grasas saturadas por da. Las grasas saturadas y trans se consideran grasas "malas", ya que elevan el colesterol LDL. Las grasas saturadas se encuentran principalmente en productos animales como carne, manteca y crema. Pero esto no significa que usted debe sacrificar todas sus comidas favoritas. Actualmente, como lo muestra el cuadro que figura al final de este documento, hay sustitutos de buen sabor, bajos en grasas y en colesterol, para la mayora de los alimentos que a usted le gusta comer. Elija aquellos alimentos alternativos que sean bajos en grasas o sin grasas. Elija cortes de carne del cuarto trasero o lomo ya que estos cortes son los que tienen menor cantidad de grasa   y colesterol. El pollo (sin piel), el pescado, la carne de ternera, y la pechuga de pavo molida son excelentes opciones. Elimine las carnes grasosas como los hotdogs o el salami. Los mariscos tienen poco o nada de grasas saturadas. Cuando  consuma carne magra, carne de aves de corral, o pescado, hgalo en porciones de 85 gramos (3 onzas). Las grasas trans tambin se llaman "aceites parcialmente hidrogenados". Son aceites manipulados cientficamente de modo que son slidos a temperatura ambiente, tienen una larga vida y mejoran el sabor y la textura de los alimentos a los que se agregan. Las grasas trans se encuentran en la margarina, masitas, crackers y alimentos horneados.  Para hornear y cocinar, el aceite es un excelente sustituto para la mantequilla. Los aceites monoinsaturados tienen un beneficio particular, ya que se cree que disminuyen el colesterol LDL (colesterol malo) y elevan el HDL. Deber evitar los aceites tropicales saturados como el de coco y el de palma.  Recuerde, adems, que puede comer sin restricciones los grupos de alimentos que son naturalmente libres de grasas saturadas y grasas trans, entre los que se incluyen el pescado, las frutas (excepto el aguacate), verduras, frijoles, cereales (cebada, arroz, cuzcuz, trigo) y las pastas (sin salsas con crema)   IDENTIFIQUE LOS ALIMENTOS QUE DISMINUYEN EL COLESTEROL  Pueden disminuir el colesterol las fibras solubles que estn en las frutas, como las manzanas, en los vegetales como el brcoli, las patatas y las zanahorias; en las legumbres como frijoles, guisantes y lentejas; y en los cereales como la cebada. Los alimentos fortificados con fitosteroles tambin pueden disminuir el colesterol. Debe consumir al menos 2 g de estos alimentos a diario para obtener el efecto de disminucin de colesterol.  En el supermercado, lea las etiquetas de los envases para identificar los alimentos bajos en grasas saturadas, libres de grasas trans y bajos en grasas, . Elija quesos que tengan solo de 2 a 3 g de grasa saturada por onza (28,35 g). Use una margarina que no dae el corazn, libre de grasas trans o aceite parcialmente hidrogenado. Al comprar alimentos horneados (galletitas dulces y  galletas) evite el aceite parcialmente hidrogenado. Los panes y bollos debern ser de granos enteros (harina de maz o de avena entera, en lugar de "harina" o "harina enriquecida"). Compre sopas en lata que no sean cremosas, con bajo contenido de sal y sin grasas adicionadas.   TCNICAS DE PREPARACIN DE LOS ALIMENTOS  Nunca fra los alimentos en aceite abundante. Si debe frer, hgalo en poco aceite y removiendo constantemente, porque as se utilizan muy pocas grasas, o utilice un spray antiadherente. Cuando le sea posible, hierva, hornee o ase las carnes y cocine los vegetales al vapor. En vez de aderezar los vegetales con mantequilla o margarina, utilice limn y hierbas, pur de manzanas y canela (para las calabazas y batatas), yogurt y salsa descremados y aderezos para ensaladas bajos en contenido graso.   BAJO EN GRASAS SATURADAS / SUSTITUTOS BAJOS EN GRASA  Carnes / Grasas saturadas (g)  Evite: Bife, corte graso (3 oz/85 g) / 11 g   Elija: Bife, corte magro (3 oz/85 g) / 4 g   Evite: [place]uesa (3 oz/85 g) / 7 g   Elija:  [place] magra (3 oz/85 g) / 5 g   Evite: Jamn (3 oz/85 g) / 6 g   Elija:  Jamn magro (3 oz/85 g) / 2.4 g   Evite: Pollo, con piel (3 oz/85 g), Carne oscura / 4 g   Elija:  Pollo, sin piel (  3 oz/85 g), Carne oscura / 2 g   Evite: Pollo, con piel (3 oz/85 g), Carne magra / 2.5 g   Elija: Pollo, sin piel (3 oz/85 g), Carne magra / 1 g  Lcteos / Grasas saturadas (g)  Evite: Leche entera (1 taza) / 5 g   Elija: Leche con bajo contenido de grasa, 2% (1 taza) / 3 g   Elija: Leche con bajo contenido de grasa, 1% (1 taza) / 1.5 g   Elija: Leche descremada (1 taza) / 0.3 g   Evite: Queso duro (1 oz/28 g) / 6 g   Elija: Queso descremado (1 oz/28 g) / 2-3 g   Evite: Queso cottage, 4% grasa (1 taza)/ 6.5 g   Elija: Queso cottage con bajo contenido de grasa, 1% grasa (1 taza)/ 1.5 g   Evite: Helado (1 taza) / 9 g   Elija: Sorbete (1 taza) / 2.5 g   Elija: Yogurt helado sin contenido de  grasa (1 taza) / 0.3 g   Elija: Barras de fruta congeladas / vestigios   Evite: Crema batida (1 cucharada) / 3.5 g   Elija: Batidos glac sin lcteos (1 cucharada) / 1 g  Condimentos / Grasas saturadas (g)  Evite: Mayonesa (1 cucharada) / 2 g   Elija: Mayonesa con bajo contenido de grasa (1 cucharada) / 1 g   Evite: [place] (1 cucharada) / 7 g   Elija: Margarina extra light (1 cucharada) / 1 g   Evite: Aceite de coco (1 cucharada) / 11.8 g   Elija: Aceite de oliva (1 cucharada) / 1.8 g   Elija: Aceite de maz (1 cucharada) / 1.7 g   Elija: Aceite de crtamo (1 cucharada) / 1.2 g   Elija: Aceite de girasol (1 cucharada) / 1.4 g   Elija: Aceite de soja (1 cucharada) / 2.4 g   Elija: Aceite de canola (1 cucharada) / 1 g  Document Released: 10/17/2005 Document Revised: 06/29/2011 [facility] Patient Information 2012 [place], [place]. Hipotiroidismo (Hypothyroidism) La tiroides es una glndula grande ubicada en la parte anterior e inferior del cuello. La glndula tiroides interviene en el control del metabolismo. El metabolismo es el modo en que el organismo utiliza los alimentos. El control del metabolismo se realiza a travs de una hormona denominada tiroxina. Cuando la actividad de esta glndula est por debajo de lo normal (hipotiroidismo) produce muy poca cantidad de hormona. CAUSAS Aqu se incluyen:   Ausencia de tejido tiroideo.  Bocio por dficit de yodo.  Bocio por medicamentos.  Defectos congnitos (desde el nacimiento).  Trastornos de la glndula pituitaria Esto ocasiona la falta de TSH (sigla que significa hormona estimulante de la tiroides) Esta hormona le informa a la tiroides que debe producir ms hormona. SNTOMAS  Letargia (sentir que no se tiene Teacher, early years/pre)  Intolerancia al fro  [place] (a pesar de una ingesta normal de alimentos)  Piel seca  Cabello seco  Irregularidades menstruales  Enlentecimiento de los procesos de pensamiento La  insuficiente cantidad de hormona tiroidea tambin puede ocasionar problemas cardacos. El hipotiroidismo en el recin nacido es el cretinismo en su forma extrema. Es importante que esta forma se trate de modo adecuado e inmediato, ya que puede conducir rpidamente al retardo del desarrollo fsico y mental. DIAGNSTICO Para comprobar la existencia de hipotiroidismo, Mining engineer anlisis de sangre y radiografas y estudios con [place]. Muchas veces los signos estn ocultos. Es necesario que el profesional vigile la enfermedad con anlisis de [place]. Esto se  realiza luego de Electrical engineer diagnstico (determinar cul es el problema). Puede ser necesario que el profesional que lo asiste controle esta enfermedad con anlisis de sangre ya sea antes o despus del diagnstico y [place]. TRATAMIENTO Los niveles bajos de hormona tiroidea se incrementan con el uso de hormona tiroidea sinttica. Este es un tratamiento seguro y [place]. Se dispone de hormona tiroidea sinttica para el tratamiento de este trastorno. Generalmente lleva algunas semanas obtener el efecto total de los medicamentos. Luego de obtener el efecto completo del [place], habitualmente pasan otras cuatro semanas para que los sntomas empiezan a Armed forces operational officer. El profesional podr comenzar indicndole dosis bajas. Si usted tuvo problemas cardacos, la dosis se aumentar de manera gradual. Podr volver a lo normal sin Firefighter una situacin de emergencia. [place] los [employer] ha indicado el profesional que lo asiste. Infrmele al profesional todos los medicamentos que toma o que ha comenzado a Radio producer. El profesional que lo asiste lo ayudar con los esquemas de las dosis.  A medida que obtiene mejora, es necesario aumentar la dosis. Ser necesario Optometrist continuos anlisis de [place], segn lo indique el profesional.  Informe acerca de todos los efectos secundarios  que sospeche que podran deberse a los medicamentos. SOLICITE ATENCIN MDICA SI: Solicite atencin mdica si observa:  Sudoracin.  Temblores.  Ansiedad.  Rpida prdida de peso.  Intolerancia al calor.  Cambios emocionales.  Diarrea.  Debilidad. SOLICITE ATENCIN MDICA DE INMEDIATO SI: Presenta dolor en el pecho, una frecuencia cardaca irregular (palpitaciones) o latidos cardacos rpidos. EST SEGURO QUE:   Comprende las instrucciones para el alta mdica.  Controlar su enfermedad.  Solicitar atencin mdica de inmediato segn las indicaciones. Document Released: 10/17/2005 Document Revised: 01/09/2012 [facility] Patient Information 2015 [place]. This information is not intended to replace advice given to you by your health care provider. Make sure you discuss any questions you have with your health care provider. Absceso o quiste de Bartolino (Bartholin's Cyst or Abscess) Las glndulas de Bartolino son glndulas pequeas ubicadas dentro de los pliegues de la piel (labios) a los lados de la apertura de la vagina (canal del parto). Cuando el conducto de la glndula se [place], puede desarrollarse un quiste. Cuando esto ocurre, el lquido que se acumula dentro del quiste puede llegar a infectarse. Esto se conoce como absceso. La glndula de Bartolino produce una mucosidad lquida en la parte externa de la vagina durante las relaciones sexuales. SNTOMAS  Los [employer] presentan un quiste pequeo no tienen problemas.  Podr sentir desde una leve molestia a un dolor intenso segn el tamao del quiste y si existe infeccin o no,  Education officer, environmental, inflamacin e hinchazn en la zona inferior de la vagina.  Dolor en [place].  Presin en las zona del perineo.  Hinchazn de los labios de la vagina.  El quiste puede estar en uno o ambos lados de la vagina. DIAGNSTICO  El profesional podr observar una gran zona hinchada en la parte inferior de la  vagina.  Es una zona dolorosa al tacto.  Si se trata de un absceso habr inflamacin y Social research officer, government. TRATAMIENTO  En algunos casos el quiste desaparecer sin tratamiento.  Aplique compresas tibias hmedas en la zona o tome baos de asiento varias veces al da.  Le practicarn una incisin para drenar el quiste o el absceso, previa aplicacin de anestesia local.  Si se trata de un absceso le indicarn un cultivo del pus.  Darreld Mclean  en ese caso le prescribirn un tratamiento con antibiticos.  Se realizar una abertura en la glndula, suturando los bordes para hacer la abertura ms grande ([place]).  Si aparece nuevamente el quiste o el absceso, le extirparn toda la glndula. PREVENCIN  Mantenga una buena higiene.  Higienice la zona vaginal con jabn neutro y un pao suave.  No frote la zona al darse un bao.  Proteja la zona de la entrepierna con un apsito si realiza largos paseos en bicicleta o a caballo.  Asegrese de estar bien lubricada cuando mantenga relaciones sexuales. INSTRUCCIONES PARA EL CUIDADO DOMICILIARIO  Si su quiste o absceso ha sido abierto, pudieran haberle colocado un pequeo trozo de gasa o un drenaje para permitir que la herida supure. La gasa o el drenaje a menos que se lo indique el profesional que le asiste.  Use toallas femeninas y no tampones cuando lo necesite en caso de drenaje o sangrado.  Si le han recetado medicamentos que combaten los grmenes (antibiticos ), tmelos exactamente de la manera que le haya sido indicada. Asegrese de terminar con todo el ciclo de antibiticos.  Utilice los medicamentos de venta libre o de prescripcin para Conservation officer, historic buildings, Health and safety inspector o la [place], segn se lo indique el profesional que lo asiste. SOLICITE ATENCIN MDICA DE INMEDIATO SI:  Aumenta el dolor, el enrojecimiento, la hinchazn o la supuracin.  La herida ha sangrado al punto que ha debido usar ms apsitos de los que la cantidad de apsitos sugerida por el  mdico en 24 horas.  Siente escalofros.  Tiene fiebre.  Tiene algn problema (sntoma) nuevo o se agravan lo ya existentes. EST SEGURO QUE:  Comprende las instrucciones para el alta mdica.  Controlar su enfermedad.  Solicitar atencin mdica de inmediato segn las indicaciones. Document Released: 10/17/2005 Document Revised: 01/09/2012 [facility] Patient Information 2015 [place]. This information is not intended to replace advice given to you by your health care provider. Make sure you discuss any questions you have with your health care provider.

## 2015-03-04 ENCOUNTER — Encounter: Payer: Self-pay | Admitting: Internal Medicine

## 2015-03-04 ENCOUNTER — Telehealth: Payer: Self-pay | Admitting: *Deleted

## 2015-03-04 DIAGNOSIS — R7989 Other specified abnormal findings of blood chemistry: Secondary | ICD-10-CM

## 2015-03-04 DIAGNOSIS — R945 Abnormal results of liver function studies: Secondary | ICD-10-CM

## 2015-03-04 NOTE — Telephone Encounter (Signed)
-----   Message from Ramond Craver, [place] sent at 03/04/2015  1:31 PM EDT ----- Regarding: referral to GI MD Per Dr. Moshe Salisbury "Also her liver function test continues to be elevated and I need for you to make an appointment for her to see the gastroenterologist."   Patient knows she will be hearing from someone with appt date/time.  Thanks!!

## 2015-03-04 NOTE — Telephone Encounter (Signed)
Referral placed they will contact pt to schedule. 

## 2015-03-05 NOTE — Telephone Encounter (Signed)
Appointment 04/29/15 @ 11:00am with GI

## 2015-03-06 LAB — WOUND CULTURE: ORGANISM ID, BACTERIA: NO GROWTH

## 2015-03-10 ENCOUNTER — Institutional Professional Consult (permissible substitution): Payer: BLUE CROSS/BLUE SHIELD | Admitting: Gynecology

## 2015-03-16 ENCOUNTER — Encounter: Payer: Self-pay | Admitting: Gynecology

## 2015-03-16 ENCOUNTER — Ambulatory Visit (INDEPENDENT_AMBULATORY_CARE_PROVIDER_SITE_OTHER): Payer: BLUE CROSS/BLUE SHIELD | Admitting: Gynecology

## 2015-03-16 VITALS — BP 124/70

## 2015-03-16 DIAGNOSIS — E038 Other specified hypothyroidism: Secondary | ICD-10-CM | POA: Diagnosis not present

## 2015-03-16 DIAGNOSIS — N750 Cyst of Bartholin's gland: Secondary | ICD-10-CM | POA: Diagnosis not present

## 2015-03-16 DIAGNOSIS — R945 Abnormal results of liver function studies: Secondary | ICD-10-CM

## 2015-03-16 DIAGNOSIS — E785 Hyperlipidemia, unspecified: Secondary | ICD-10-CM

## 2015-03-16 DIAGNOSIS — R7989 Other specified abnormal findings of blood chemistry: Secondary | ICD-10-CM

## 2015-03-16 LAB — TSH: TSH: 3.646 u[IU]/mL (ref 0.350–4.500)

## 2015-03-16 MED ORDER — LEVOTHYROXINE SODIUM 25 MCG PO TABS: 25.0000 ug | ORAL_TABLET | Freq: Every day | ORAL | Status: DC

## 2015-03-16 NOTE — Patient Instructions (Signed)
Control del colesterol  Los niveles de colesterol en el organismo estn determinados significativamente por su dieta. Los niveles de colesterol tambin se relacionan con la enfermedad cardaca. El material que sigue ayuda a explicar esta relacin y a analizar qu puede hacer para mantener su corazn sano. No todo el colesterol es malo. Las lipoprotenas de baja densidad (LDL) forman el colesterol "malo". El colesterol malo puede ocasionar depsitos de grasa que se acumulan en el interior de las arterias. Las lipoprotenas de alta densidad (HDL) es el colesterol "bueno". Ayuda a remover el colesterol LDL "malo" de la sangre. El colesterol es un factor de riesgo muy importante para la enfermedad cardaca. Otros factores de riesgo son la hipertensin arterial, el hbito de fumar, el estrs, la herencia y el peso.   El msculo cardaco obtiene el suministro de sangre a travs de las arterias coronarias. Si su colesterol LDL ("malo") est elevado y el HDL ("bueno") es bajo, tiene un factor de riesgo para que se formen depsitos de grasa en las arterias coronarias (los vasos sanguneos que suministran sangre al corazn). Esto hace que haya menos lugar para que la sangre circule. Sin la suficiente sangre y oxgeno, el msculo cardaco no puede funcionar correctamente, y usted podr sentir dolores en el pecho (angina pectoris). Cuando una arteria coronaria se cierra completamente, una parte del msculo cardaco puede morir (infarto de miocardio).  CONTROL DEL COLESTEROL Cuando el profesional que lo asiste enva la sangre al laboratorio para conocer el nivel de colesterol, puede realizarle tambin un perfil completo de los lpidos. Con esta prueba, se puede determinar la cantidad total de colesterol, as como los niveles de LDL y HDL. Los triglicridos son un tipo de grasa que circula en la sangre y que tambin puede utilizarse  para determinar el riesgo de enfermedad cardaca. En la siguiente tabla se establecen los nmeros ideales: Prueba: Colesterol total  Menos de 200 mg/dl.  Prueba: LDL "colesterol malo"  Menos de 100 mg/dl.   Menos de 70 mg/dl si tiene riesgo muy elevado de sufrir un ataque cardaco o muerte cardaca sbita.  Prueba: HDL "colesterol bueno"  Mujeres: Ms de 50 mg/dl.   Hombres: Ms de 40 mg/dl.  Prueba: Trigliceridos  Menos de 150 mg/dl.    CONTROL DEL COLESTEROL CON DIETA Aunque factores como el ejercicio y el estilo de vida son importantes, la "primera lnea de ataque" es la dieta. Esto se debe a que se sabe que ciertos alimentos hacen subir el colesterol y otros lo bajan. El objetivo debe ser equilibrar los alimentos, de modo que tengan un efecto sobre el colesterol y, an ms importante, reemplazar las grasas saturadas y trans con otros tipos de grasas, como las monoinsaturadas y las poliinsaturadas y cidos grasos omega-3 . En promedio, una persona no debe consumir ms de 15 a 17 g de grasas saturadas por da. Las grasas saturadas y trans se consideran grasas "malas", ya que elevan el colesterol LDL. Las grasas saturadas se encuentran principalmente en productos animales como carne, manteca y crema. Pero esto no significa que usted debe sacrificar todas sus comidas favoritas. Actualmente, como lo muestra el cuadro que figura al final de este documento, hay sustitutos de buen sabor, bajos en grasas y en colesterol, para la mayora de los alimentos que a usted le gusta comer. Elija aquellos alimentos alternativos que sean bajos en grasas o sin grasas. Elija cortes de carne del cuarto trasero o lomo ya que estos cortes son los que tienen menor cantidad de grasa   y colesterol. El pollo (sin piel), el pescado, la carne de ternera, y la pechuga de pavo molida son excelentes opciones. Elimine las carnes grasosas como los hotdogs o el salami. Los mariscos tienen poco o nada de grasas saturadas. Cuando  consuma carne magra, carne de aves de corral, o pescado, hgalo en porciones de 85 gramos (3 onzas). Las grasas trans tambin se llaman "aceites parcialmente hidrogenados". Son aceites manipulados cientficamente de modo que son slidos a temperatura ambiente, tienen una larga vida y mejoran el sabor y la textura de los alimentos a los que se agregan. Las grasas trans se encuentran en la margarina, masitas, crackers y alimentos horneados.  Para hornear y cocinar, el aceite es un excelente sustituto para la mantequilla. Los aceites monoinsaturados tienen un beneficio particular, ya que se cree que disminuyen el colesterol LDL (colesterol malo) y elevan el HDL. Deber evitar los aceites tropicales saturados como el de coco y el de palma.  Recuerde, adems, que puede comer sin restricciones los grupos de alimentos que son naturalmente libres de grasas saturadas y grasas trans, entre los que se incluyen el pescado, las frutas (excepto el aguacate), verduras, frijoles, cereales (cebada, arroz, cuzcuz, trigo) y las pastas (sin salsas con crema)   IDENTIFIQUE LOS ALIMENTOS QUE DISMINUYEN EL COLESTEROL  Pueden disminuir el colesterol las fibras solubles que estn en las frutas, como las manzanas, en los vegetales como el brcoli, las patatas y las zanahorias; en las legumbres como frijoles, guisantes y lentejas; y en los cereales como la cebada. Los alimentos fortificados con fitosteroles tambin pueden disminuir el colesterol. Debe consumir al menos 2 g de estos alimentos a diario para obtener el efecto de disminucin de colesterol.  En el supermercado, lea las etiquetas de los envases para identificar los alimentos bajos en grasas saturadas, libres de grasas trans y bajos en grasas, . Elija quesos que tengan solo de 2 a 3 g de grasa saturada por onza (28,35 g). Use una margarina que no dae el corazn, libre de grasas trans o aceite parcialmente hidrogenado. Al comprar alimentos horneados (galletitas dulces y  galletas) evite el aceite parcialmente hidrogenado. Los panes y bollos debern ser de granos enteros (harina de maz o de avena entera, en lugar de "harina" o "harina enriquecida"). Compre sopas en lata que no sean cremosas, con bajo contenido de sal y sin grasas adicionadas.   TCNICAS DE PREPARACIN DE LOS ALIMENTOS  Nunca fra los alimentos en aceite abundante. Si debe frer, hgalo en poco aceite y removiendo constantemente, porque as se utilizan muy pocas grasas, o utilice un spray antiadherente. Cuando le sea posible, hierva, hornee o ase las carnes y cocine los vegetales al vapor. En vez de aderezar los vegetales con mantequilla o margarina, utilice limn y hierbas, pur de manzanas y canela (para las calabazas y batatas), yogurt y salsa descremados y aderezos para ensaladas bajos en contenido graso.   BAJO EN GRASAS SATURADAS / SUSTITUTOS BAJOS EN GRASA  Carnes / Grasas saturadas (g)  Evite: Bife, corte graso (3 oz/85 g) / 11 g   Elija: Bife, corte magro (3 oz/85 g) / 4 g   Evite: Hamburguesa (3 oz/85 g) / 7 g   Elija:  [place]amburguesa magra (3 oz/85 g) / 5 g   Evite: Jamn (3 oz/85 g) / 6 g   Elija:  Jamn magro (3 oz/85 g) / 2.4 g   Evite: Pollo, con piel (3 oz/85 g), Carne oscura / 4 g   Elija:  Pollo, sin piel (  3 oz/85 g), Carne oscura / 2 g   Evite: Pollo, con piel (3 oz/85 g), Carne magra / 2.5 g   Elija: Pollo, sin piel (3 oz/85 g), Carne magra / 1 g  Lcteos / Grasas saturadas (g)  Evite: Leche entera (1 taza) / 5 g   Elija: Leche con bajo contenido de grasa, 2% (1 taza) / 3 g   Elija: Leche con bajo contenido de grasa, 1% (1 taza) / 1.5 g   Elija: Leche descremada (1 taza) / 0.3 g   Evite: Queso duro (1 oz/28 g) / 6 g   Elija: Queso descremado (1 oz/28 g) / 2-3 g   Evite: Queso cottage, 4% grasa (1 taza)/ 6.5 g   Elija: Queso cottage con bajo contenido de grasa, 1% grasa (1 taza)/ 1.5 g   Evite: Helado (1 taza) / 9 g   Elija: Sorbete (1 taza) / 2.5 g   Elija: Yogurt helado sin contenido de  grasa (1 taza) / 0.3 g   Elija: Barras de fruta congeladas / vestigios   Evite: Crema batida (1 cucharada) / 3.5 g   Elija: Batidos glac sin lcteos (1 cucharada) / 1 g  Condimentos / Grasas saturadas (g)  Evite: Mayonesa (1 cucharada) / 2 g   Elija: Mayonesa con bajo contenido de grasa (1 cucharada) / 1 g   Evite: Manteca (1 cucharada) / 7 g   Elija: Margarina extra light (1 cucharada) / 1 g   Evite: Aceite de coco (1 cucharada) / 11.8 g   Elija: Aceite de oliva (1 cucharada) / 1.8 g   Elija: Aceite de maz (1 cucharada) / 1.7 g   Elija: Aceite de crtamo (1 cucharada) / 1.2 g   Elija: Aceite de girasol (1 cucharada) / 1.4 g   Elija: Aceite de soja (1 cucharada) / 2.4 g   Elija: Aceite de canola (1 cucharada) / 1 g  Document Released: 10/17/2005 Document Revised: 06/29/2011 [facility] Patient Information 2012 [place], [place]. Ejercicios para perder peso (Exercise to Lose Weight) La actividad fsica y Ardelia Mems dieta saludable ayudan a perder peso. El mdico podr sugerirle ejercicios especficos. IDEAS Y CONSEJOS PARA HACER EJERCICIOS  Elija opciones econmicas que disfrute hacer , como caminar, andar en bicicleta o los vdeos para ejercitarse.   Utilice las Clinical cytogeneticist del ascensor.   Camine durante la hora del almuerzo.   Estacione el auto lejos del lugar de [place] o [place].   Concurra a un gimnasio o tome clases de gimnasia.   Comience con 5  10 minutos de actividad fsica por da. Ejercite hasta 30 minutos, 4 a 6 das por semana.   Utilice zapatos que tengan un buen soporte y ropas cmodas.   Elongue antes y despus de Chief Technology Officer.   Ejercite hasta que aumente la respiracin y el corazn palpite rpido.   Beba agua extra cuando ejercite.   No haga ejercicio Contractor, sentirse mareado o que le falte mucho el aire.  La actividad fsica puede quemar alrededor de 150 caloras.  Correr 20 cuadras en 15 minutos.   Jugar vley durante 45 a 60  minutos.   Limpiar y encerar el auto durante 45 a 60 minutos.   Jugar ftbol americano de toque.   Caminar 25 cuadras en 35 minutos.   Empujar un cochecito 20 cuadras en 30 minutos.   Jugar baloncesto durante 30 minutos.   Rastrillar hojas secas durante 30 minutos.   Andar en bicicleta 80 cuadras en 30 minutos.  Caminar 30 cuadras en 30 minutos.   Bailar durante 30 minutos.   Quitar la nieve con una pala durante 15 minutos.   Nadar vigorosamente durante 20 minutos.   Subir escaleras durante 15 minutos.   Andar en bicicleta 60 cuadras durante 15 minutos.   Arreglar el jardn entre 30 y 62 minutos.   Saltar a la soga durante 15 minutos.   Limpiar vidrios o pisos durante 45 a 60 minutos.  Document Released: 01/21/2011 Document Revised: 06/29/2011 [facility] Patient Information 2012 [place].Levothyroxine tablets What is this medicine? LEVOTHYROXINE (lee voe thye ROX een) is a thyroid hormone. This medicine can improve symptoms of thyroid deficiency such as slow speech, lack of energy, weight gain, hair loss, dry skin, and feeling cold. It also helps to treat goiter (an enlarged thyroid gland). It is also used to treat some kinds of thyroid cancer along with surgery and other medicines. This medicine may be used for other purposes; ask your health care provider or pharmacist if you have questions. COMMON BRAND NAME(S): Estre, Levo-T, Levothroid, Levoxyl, Synthroid, Thyro-Tabs, Unithroid What should I tell my health care provider before I take this medicine? They need to know if you have any of these conditions: -angina -blood clotting problems -diabetes -dieting or on a weight loss program -fertility problems -heart disease -high levels of thyroid hormone -pituitary gland problem -previous heart attack -an unusual or allergic reaction to levothyroxine, thyroid hormones, other medicines, foods, dyes, or preservatives -pregnant or trying to get  pregnant -breast-feeding How should I use this medicine? Take this medicine by mouth with plenty of water. It is best to take on an empty stomach, at least 30 minutes before or 2 hours after food. Follow the directions on the prescription label. Take at the same time each day. Do not take your medicine more often than directed. Contact your pediatrician regarding the use of this medicine in children. While this drug may be prescribed for children and infants as young as a few days of age for selected conditions, precautions do apply. For infants, you may crush the tablet and place in a small amount of (5-10 ml or 1 to 2 teaspoonfuls) of water, breast milk, or non-soy based infant formula. Do not mix with soy-based infant formula. Give as directed. Overdosage: If you think you have taken too much of this medicine contact a poison control center or emergency room at once. NOTE: This medicine is only for you. Do not share this medicine with others. What if I miss a dose? If you miss a dose, take it as soon as you can. If it is almost time for your next dose, take only that dose. Do not take double or extra doses. What may interact with this medicine? -amiodarone -antacids -anti-thyroid medicines -calcium supplements -carbamazepine -cholestyramine -colestipol -digoxin -female hormones, including contraceptive or birth control pills -iron supplements -ketamine -liquid nutrition products like Ensure -medicines for colds and breathing difficulties -medicines for diabetes -medicines for mental depression -medicines or herbals used to decrease weight or appetite -phenobarbital or other barbiturate medications -phenytoin -prednisone or other corticosteroids -rifabutin -rifampin -soy isoflavones -sucralfate -theophylline -warfarin This list may not describe all possible interactions. Give your health care provider a list of all the medicines, herbs, non-prescription drugs, or dietary  supplements you use. Also tell them if you smoke, drink alcohol, or use illegal drugs. Some items may interact with your medicine. What should I watch for while using this medicine? Be sure to take this medicine with plenty  of fluids. Some tablets may cause choking, gagging, or difficulty swallowing from the tablet getting stuck in your throat. Most of these problems disappear if the medicine is taken with the right amount of water or other fluids. Do not switch brands of this medicine unless your health care professional agrees with the change. Ask questions if you are uncertain. You will need regular exams and occasional blood tests to check the response to treatment. If you are receiving this medicine for an underactive thyroid, it may be several weeks before you notice an improvement. Check with your doctor or health care professional if your symptoms do not improve. It may be necessary for you to take this medicine for the rest of your life. Do not stop using this medicine unless your doctor or health care professional advises you to. This medicine can affect blood sugar levels. If you have diabetes, check your blood sugar as directed. You may lose some of your hair when you first start treatment. With time, this usually corrects itself. If you are going to have surgery, tell your doctor or health care professional that you are taking this medicine. What side effects may I notice from receiving this medicine? Side effects that you should report to your doctor or health care professional as soon as possible: -allergic reactions like skin rash, itching or hives, swelling of the face, lips, or tongue -chest pain -excessive sweating or intolerance to heat -fast or irregular heartbeat -nervousness -skin rash or hives -swelling of ankles, feet, or legs -tremors Side effects that usually do not require medical attention (report to your doctor or health care professional if they continue or are  bothersome): -changes in appetite -changes in menstrual periods -diarrhea -hair loss -headache -trouble sleeping -weight loss This list may not describe all possible side effects. Call your doctor for medical advice about side effects. You may report side effects to FDA at 1-800-FDA-1088. Where should I keep my medicine? Keep out of the reach of children. Store at room temperature between 15 and 30 degrees C (59 and 86 degrees F). Protect from light and moisture. Keep container tightly closed. Throw away any unused medicine after the expiration date. NOTE: This sheet is a summary. It may not cover all possible information. If you have questions about this medicine, talk to your doctor, pharmacist, or health care provider.  2015, Elsevier/Gold Standard. (2009-01-23 14:28:07) Hipotiroidismo (Hypothyroidism) La tiroides es una glndula grande ubicada en la parte anterior e inferior del cuello. La glndula tiroides interviene en el control del metabolismo. El metabolismo es el modo en que el organismo utiliza los alimentos. El control del metabolismo se realiza a travs de una hormona denominada tiroxina. Cuando la actividad de esta glndula est por debajo de lo normal (hipotiroidismo) produce muy poca cantidad de hormona. CAUSAS Aqu se incluyen:  Ausencia de tejido tiroideo. Bocio por dficit de yodo. Bocio por medicamentos. Defectos congnitos (desde el nacimiento). Trastornos de la glndula pituitaria Esto ocasiona la falta de TSH (sigla que significa hormona estimulante de la tiroides) Esta hormona le informa a la tiroides que debe producir ms hormona. SNTOMAS Letargia (sentir que no se tiene Teacher, early years/pre) Intolerancia al fro [place] (a pesar de una ingesta normal de alimentos) Piel seca [place] Irregularidades menstruales Enlentecimiento de los procesos de pensamiento La insuficiente cantidad de hormona tiroidea tambin puede ocasionar problemas cardacos. El  hipotiroidismo en el recin nacido es el cretinismo en su forma extrema. Es importante que esta forma se trate de [place]  adecuado e inmediato, ya que puede conducir rpidamente al retardo del desarrollo fsico y mental. DIAGNSTICO Para comprobar la existencia de hipotiroidismo, Mining engineer anlisis de sangre y radiografas y estudios con [place]. Muchas veces los signos estn ocultos. Es necesario que el profesional vigile la enfermedad con anlisis de [place]. Esto se realiza luego de Electrical engineer diagnstico (determinar cul es el problema). Puede ser necesario que el profesional que lo asiste controle esta enfermedad con anlisis de sangre ya sea antes o despus del diagnstico y [place]. TRATAMIENTO Los niveles bajos de hormona tiroidea se incrementan con el uso de hormona tiroidea sinttica. Este es un tratamiento seguro y [place]. Se dispone de hormona tiroidea sinttica para el tratamiento de este trastorno. Generalmente lleva algunas semanas obtener el efecto total de los medicamentos. Luego de obtener el efecto completo del [place], habitualmente pasan otras cuatro semanas para que los sntomas empiezan a Armed forces operational officer. El profesional podr comenzar indicndole dosis bajas. Si usted tuvo problemas cardacos, la dosis se aumentar de manera gradual. Podr volver a lo normal sin Firefighter una situacin de emergencia. [place] los [employer] ha indicado el profesional que lo asiste. Infrmele al profesional todos los medicamentos que toma o que ha comenzado a Radio producer. El profesional que lo asiste lo ayudar con los esquemas de las dosis. A medida que obtiene mejora, es necesario aumentar la dosis. Ser necesario Optometrist continuos anlisis de [place], segn lo indique el profesional. Informe acerca de todos los efectos secundarios que sospeche que podran deberse a los medicamentos. SOLICITE ATENCIN MDICA SI: Solicite  atencin mdica si observa: Sudoracin. Temblores. Ansiedad. Rpida prdida de peso. Intolerancia al calor. Cambios emocionales. Diarrea. Debilidad. SOLICITE ATENCIN MDICA DE INMEDIATO SI: Presenta dolor en el pecho, una frecuencia cardaca irregular (palpitaciones) o latidos cardacos rpidos. EST SEGURO QUE:  Comprende las instrucciones para el alta mdica. Controlar su enfermedad. Solicitar atencin mdica de inmediato segn las indicaciones. Document Released: 10/17/2005 Document Revised: 01/09/2012 [facility] Patient Information 2015 [place]. This information is not intended to replace advice given to you by your health care provider. Make sure you discuss any questions you have with your health care provider.

## 2015-03-16 NOTE — Progress Notes (Signed)
   Patient presented to the office today to remove her Ward catheter that had been placed on 03/03/2015 as a result of her Bartholin duct cyst. She's also here to discuss her lab results as follows:  Her TSH was found to be elevated at 6.409 [facility] was found to be elevated 85.9 Her lipid profile: Total cholesterol 274, triglycerides elevated to 223 LDL elevated at 167 CBC: Slightly elevated eosinophil Elevated SGPT  Her labs once again demonstrated to her SGPT was still slightly elevated and she was referred to the gastroenterologist further evaluation as well as for screening colonoscopy.  Although her Ross is elevated she is not having any vasomotor symptoms. We discussed today her elevated total cholesterol as well as her elevated triglycerides and LDL and recommended treatment will refer to internist and she states that she was to work on a diet and exercise and return back in 6 months for fasting lipid profile.  We discussed hypothyroidism diagnosis management and treatment and for this reason she is going to be prescribed Synthroid 25 g daily.  Exam: Blood pressure 124/70 Pelvic: Bartholin urethra Skene glands: Ward catheter was decompressed removed and discarded. The area was copiously irrigated with hydrogen peroxide in the Neosporin was applied. Patient will continue to do sitz baths nightly and apply Neosporin for the next 7-10 days.  Assessment/plan: #1 hypothyroidism was prescribed Synthroid 25 g daily will check TSH in 1 month #2 hyperlipidemia refuses treatment dietary instructions as well as exercise program provided she'll return back in 6 months for follow-up fasting lipid profile #3 elevated liver function tests patient has appointment with gastroenterologist as well as for screening colonoscopy #4 patient with elevated FSH no vasomotor symptoms no indication for hormone replacement therapy at this time.

## 2015-03-17 LAB — HEPATITIS PANEL, ACUTE
HCV AB: NEGATIVE
HEP A IGM: NONREACTIVE
HEP B C IGM: NONREACTIVE
Hepatitis B Surface Ag: NEGATIVE

## 2015-03-27 ENCOUNTER — Ambulatory Visit
Admission: RE | Admit: 2015-03-27 | Discharge: 2015-03-27 | Disposition: A | Discharge status: Home / Self Care | Payer: BLUE CROSS/BLUE SHIELD | Source: Ambulatory Visit

## 2015-03-27 DIAGNOSIS — Z1231 Encounter for screening mammogram for malignant neoplasm of breast: Secondary | ICD-10-CM

## 2015-04-16 ENCOUNTER — Ambulatory Visit: Payer: BLUE CROSS/BLUE SHIELD | Admitting: Gynecology

## 2015-04-23 ENCOUNTER — Ambulatory Visit: Payer: Self-pay | Admitting: Gynecology

## 2015-04-27 ENCOUNTER — Telehealth: Payer: Self-pay | Admitting: Internal Medicine

## 2015-04-27 NOTE — Telephone Encounter (Signed)
Dr. Henrene Pastor,  Patient called to reschedule their same day appointment.  Stated she was running a low grade fever, vomiting, and having diarrhea.  Patient has been rescheduled.

## 2015-04-29 ENCOUNTER — Ambulatory Visit: Payer: BLUE CROSS/BLUE SHIELD | Admitting: Internal Medicine

## 2015-07-08 ENCOUNTER — Encounter: Payer: Self-pay | Admitting: Internal Medicine

## 2015-07-08 ENCOUNTER — Ambulatory Visit (INDEPENDENT_AMBULATORY_CARE_PROVIDER_SITE_OTHER): Payer: BLUE CROSS/BLUE SHIELD | Admitting: Internal Medicine

## 2015-07-08 ENCOUNTER — Other Ambulatory Visit (INDEPENDENT_AMBULATORY_CARE_PROVIDER_SITE_OTHER): Payer: BLUE CROSS/BLUE SHIELD

## 2015-07-08 VITALS — BP 106/70 | HR 96 | Ht 65.0 in | Wt 156.4 lb

## 2015-07-08 DIAGNOSIS — R7989 Other specified abnormal findings of blood chemistry: Secondary | ICD-10-CM

## 2015-07-08 DIAGNOSIS — R197 Diarrhea, unspecified: Secondary | ICD-10-CM | POA: Diagnosis not present

## 2015-07-08 DIAGNOSIS — Z1211 Encounter for screening for malignant neoplasm of colon: Secondary | ICD-10-CM | POA: Diagnosis not present

## 2015-07-08 DIAGNOSIS — R945 Abnormal results of liver function studies: Principal | ICD-10-CM

## 2015-07-08 DIAGNOSIS — R1084 Generalized abdominal pain: Secondary | ICD-10-CM

## 2015-07-08 DIAGNOSIS — K625 Hemorrhage of anus and rectum: Secondary | ICD-10-CM

## 2015-07-08 LAB — PROTIME-INR
INR: 1 ratio (ref 0.8–1.0)
Prothrombin Time: 11.5 s (ref 9.6–13.1)

## 2015-07-08 LAB — FERRITIN: FERRITIN: 76.6 ng/mL (ref 10.0–291.0)

## 2015-07-08 LAB — IBC PANEL
Iron: 145 ug/dL (ref 42–145)
Saturation Ratios: 33.5 % (ref 20.0–50.0)
Transferrin: 309 mg/dL (ref 212.0–360.0)

## 2015-07-08 LAB — IRON: IRON: 145 ug/dL (ref 42–145)

## 2015-07-08 LAB — IGA: IgA: 272 mg/dL (ref 68–378)

## 2015-07-08 MED ORDER — NA SULFATE-K SULFATE-MG SULF 17.5-3.13-1.6 GM/177ML PO SOLN: 1.0000 | Freq: Once | ORAL | Status: DC

## 2015-07-08 NOTE — Patient Instructions (Addendum)
You have been given a separate informational sheet regarding your tobacco use, the importance of quitting and local resources to help you quit.   Your physician has requested that you go to the basement for lab work before leaving today  You have been scheduled for an endoscopy and colonoscopy. Please follow the written instructions given to you at your visit today. Please pick up your prep supplies at the pharmacy within the next 1-3 days. If you use inhalers (even only as needed), please bring them with you on the day of your procedure. Your physician has requested that you go to [email or web address] and enter the access code given to you at your visit today. This web site gives a general overview about your procedure. However, you should still follow specific instructions given to you by our office regarding your preparation for the procedure.   You have been scheduled for an abdominal ultrasound at [facility] (1st floor of hospital) on 07/15/2015 at 9:00am. Please arrive 15 minutes prior to your appointment for registration. Make certain not to have anything to eat or drink 8 hours prior to your appointment. Should you need to reschedule your appointment, please contact radiology at [phone]. This test typically takes about 30 minutes to perform.

## 2015-07-08 NOTE — Progress Notes (Signed)
HISTORY OF PRESENT ILLNESS:  Denise Benitez is a 49 y.o. [place] female  who is referred through the courtesy of Dr. Uvaldo Rising with chief complaints of GERD, abdominal discomfort, loose stools, minor rectal bleeding, elevated liver tests, and the need for colonoscopy. The patient is status post remote cholecystectomy. Since that time she will notice abdominal discomfort described as a stomachache followed by diarrhea. This can occur several times per month. She does have minor intermittent infrequent rectal bleeding which she attributes to hemorrhoids. No fevers or weight loss. Next, intermittent problems with heartburn for which she takes Tums. Particularly noticeable with spicy foods or red sauce. No dysphagia. Review of outside laboratories shows mild elevation of ALT in April and May 2016. Values were 37 and 39 respectively. Other liver tests normal. She does have dyslipidemia. CBC normal including platelets. She has approximate 10 beers on the weekend. Her father had a history of cirrhosis of the liver and was a significant alcohol consumer. Patient denies a history of transfusions, hepatitis, or other liver conditions. No recent imaging.  REVIEW OF SYSTEMS:  All non-GI ROS negative except for sinus and allergy trouble  Past Medical History  Diagnosis Date  . Perimenopause   . Bartholin gland cyst   . Hypothyroidism   . Hyperlipidemia   . Elevated LFTs   . Anxiety   . Asthma   . Hemorrhoids   . Varicose veins     Past Surgical History  Procedure Laterality Date  . Cholecystectomy  1995  . Pelvic laparoscopy      ovarian cystectomy  . Vein surgery  1993    Social History Denise Benitez  reports that she has been smoking Cigarettes.  She has been smoking about 0.50 packs per day. She has never used smokeless tobacco. She reports that she drinks alcohol. She reports that she does not use illicit drugs.  family history includes Breast cancer in her paternal grandmother;  Colon polyps in her paternal aunt; Hypertension in her father, paternal aunt, paternal grandmother, and paternal uncle; Liver disease in her father; Stomach cancer in her paternal uncle.  Allergies  Allergen Reactions  . Codeine Itching       PHYSICAL EXAMINATION: Vital signs: BP 106/70 mmHg  Pulse 96  Ht 5\' 5"  (1.651 m)  Wt 156 lb 6 oz (70.931 kg)  BMI 26.02 kg/m2  LMP 05/07/2015  Constitutional: generally well-appearing, no acute distress Psychiatric: alert and oriented x3, cooperative Eyes: extraocular movements intact, anicteric, conjunctiva pink Mouth: oral pharynx moist, no lesions Neck: supple without thyromegaly Lymph: no lymphadenopathy Cardiovascular: heart regular rate and rhythm, no murmur Lungs: clear to auscultation bilaterally Abdomen: soft, nontender, nondistended, no obvious ascites, no peritoneal signs, normal bowel sounds, no organomegaly Rectal: Deferred until upcoming colonoscopy Extremities: no lower extremity edema bilaterally Skin: no lesions on visible extremities Neuro: No focal deficits. No asterixis.   ASSESSMENT:  #1. Chronic intermittent GERD with dietary indiscretion. #2. Intermittent abdominal discomfort, seemingly postprandial. Often associated with loose stools. May be bile salt related post cholecystectomy. Question IBS #3. Loose stools. As above #4. Minor rectal bleeding #5. Screening colonoscopy. Appropriate candidate without contraindication #6. Mild elevation of AST. Family history of cirrhosis in her father. The patient uses moderate alcohol   PLAN:  #1. Reflux precautions #2. Schedule upper endoscopy to evaluate chronic reflux and intermittent upper abdominal pain.The nature of the procedure, as well as the risks, benefits, and alternatives were carefully and thoroughly reviewed with the patient. Ample time for discussion  and questions allowed. The patient understood, was satisfied, and agreed to proceed. #3. Schedule colonoscopy  for colon cancer screening and to evaluate intermittent loose stools and minor rectal bleeding.The nature of the procedure, as well as the risks, benefits, and alternatives were carefully and thoroughly reviewed with the patient. Ample time for discussion and questions allowed. The patient understood, was satisfied, and agreed to proceed. We also discussed the colonoscopy Prep #4. Obtain a chronic viral and nonviral laboratory studies to evaluate elevated ALT #5. Abdominal ultrasound to evaluate abnormal liver tests and abdominal discomfort   A copy of this consultation note has been sent to Dr. Lamar Blinks

## 2015-07-09 LAB — HEPATITIS C ANTIBODY: HCV Ab: NEGATIVE

## 2015-07-09 LAB — HEPATITIS B SURFACE ANTIBODY,QUALITATIVE: Hep B S Ab: NEGATIVE

## 2015-07-09 LAB — ANTI-NUCLEAR AB-TITER (ANA TITER)

## 2015-07-09 LAB — TISSUE TRANSGLUTAMINASE, IGG: Tissue Transglut Ab: 1 U/mL (ref <6)

## 2015-07-09 LAB — HEPATITIS B SURFACE ANTIGEN: Hepatitis B Surface Ag: NEGATIVE

## 2015-07-09 LAB — ANA: Anti Nuclear Antibody(ANA): POSITIVE — AB

## 2015-07-10 LAB — CERULOPLASMIN: Ceruloplasmin: 35 mg/dL (ref 18–53)

## 2015-07-10 LAB — ALPHA-1-ANTITRYPSIN: A1 ANTITRYPSIN SER: 103 mg/dL (ref 83–199)

## 2015-07-10 LAB — ANTI-SMOOTH MUSCLE ANTIBODY, IGG: SMOOTH MUSCLE AB: 38 U — AB (ref <20)

## 2015-07-10 LAB — MITOCHONDRIAL ANTIBODIES: MITOCHONDRIAL M2 AB, IGG: 0.55 (ref <0.91)

## 2015-07-15 ENCOUNTER — Ambulatory Visit (HOSPITAL_COMMUNITY): Payer: BLUE CROSS/BLUE SHIELD

## 2015-07-21 ENCOUNTER — Telehealth: Payer: Self-pay | Admitting: *Deleted

## 2015-07-21 MED ORDER — ZOLPIDEM TARTRATE 10 MG PO TABS: 10.0000 mg | ORAL_TABLET | Freq: Every evening | ORAL | Status: DC | PRN

## 2015-07-21 NOTE — Telephone Encounter (Signed)
May have  30 tablets with 3 refills

## 2015-07-21 NOTE — Telephone Encounter (Signed)
Rx called in pt aware.

## 2015-07-21 NOTE — Telephone Encounter (Signed)
Pt called requesting refill on Ambien 10 mg, last filled in May with no refills. Please advise

## 2015-07-22 ENCOUNTER — Ambulatory Visit (HOSPITAL_COMMUNITY)
Admission: RE | Admit: 2015-07-22 | Discharge: 2015-07-22 | Disposition: A | Discharge status: Home / Self Care | Payer: BLUE CROSS/BLUE SHIELD | Source: Ambulatory Visit | Attending: Internal Medicine | Admitting: Internal Medicine

## 2015-07-22 DIAGNOSIS — R109 Unspecified abdominal pain: Secondary | ICD-10-CM | POA: Diagnosis present

## 2015-07-22 DIAGNOSIS — R932 Abnormal findings on diagnostic imaging of liver and biliary tract: Secondary | ICD-10-CM | POA: Insufficient documentation

## 2015-07-22 DIAGNOSIS — R7989 Other specified abnormal findings of blood chemistry: Secondary | ICD-10-CM | POA: Diagnosis not present

## 2015-07-22 DIAGNOSIS — Z9049 Acquired absence of other specified parts of digestive tract: Secondary | ICD-10-CM | POA: Diagnosis not present

## 2015-07-22 DIAGNOSIS — R1084 Generalized abdominal pain: Secondary | ICD-10-CM

## 2015-07-22 DIAGNOSIS — Z1211 Encounter for screening for malignant neoplasm of colon: Secondary | ICD-10-CM

## 2015-07-22 DIAGNOSIS — R945 Abnormal results of liver function studies: Secondary | ICD-10-CM

## 2015-08-11 ENCOUNTER — Other Ambulatory Visit: Payer: Self-pay | Admitting: *Deleted

## 2015-08-11 DIAGNOSIS — E785 Hyperlipidemia, unspecified: Secondary | ICD-10-CM

## 2015-09-01 ENCOUNTER — Ambulatory Visit (AMBULATORY_SURGERY_CENTER): Payer: BLUE CROSS/BLUE SHIELD | Admitting: Internal Medicine

## 2015-09-01 ENCOUNTER — Encounter: Payer: Self-pay | Admitting: Internal Medicine

## 2015-09-01 VITALS — BP 129/80 | HR 77 | Temp 96.9°F | Resp 22 | Ht 65.0 in | Wt 156.0 lb

## 2015-09-01 DIAGNOSIS — R1084 Generalized abdominal pain: Secondary | ICD-10-CM | POA: Diagnosis present

## 2015-09-01 DIAGNOSIS — K2100 Gastro-esophageal reflux disease with esophagitis, without bleeding: Secondary | ICD-10-CM

## 2015-09-01 DIAGNOSIS — K2970 Gastritis, unspecified, without bleeding: Secondary | ICD-10-CM | POA: Diagnosis not present

## 2015-09-01 DIAGNOSIS — D123 Benign neoplasm of transverse colon: Secondary | ICD-10-CM

## 2015-09-01 DIAGNOSIS — K2990 Gastroduodenitis, unspecified, without bleeding: Secondary | ICD-10-CM | POA: Diagnosis not present

## 2015-09-01 DIAGNOSIS — Z1211 Encounter for screening for malignant neoplasm of colon: Secondary | ICD-10-CM | POA: Diagnosis not present

## 2015-09-01 DIAGNOSIS — K219 Gastro-esophageal reflux disease without esophagitis: Secondary | ICD-10-CM

## 2015-09-01 MED ORDER — OMEPRAZOLE 20 MG PO CPDR: 20.0000 mg | DELAYED_RELEASE_CAPSULE | Freq: Every day | ORAL | Status: DC

## 2015-09-01 MED ORDER — SODIUM CHLORIDE 0.9 % IV SOLN: 500.0000 mL | INTRAVENOUS | Status: DC

## 2015-09-01 NOTE — Patient Instructions (Signed)
Discharge instructions given. Handouts on polyps,diverticulosis and GERD. Resume previous medications. Prescription sent into pharmacy. YOU HAD AN ENDOSCOPIC PROCEDURE TODAY AT [facility]:   Refer to the procedure report that was given to you for any specific questions about what was found during the examination.  If the procedure report does not answer your questions, please call your gastroenterologist to clarify.  If you requested that your care partner not be given the details of your procedure findings, then the procedure report has been included in a sealed envelope for you to review at your convenience later.  YOU SHOULD EXPECT: Some feelings of bloating in the abdomen. Passage of more gas than usual.  Walking can help get rid of the air that was put into your GI tract during the procedure and reduce the bloating. If you had a lower endoscopy (such as a colonoscopy or flexible sigmoidoscopy) you may notice spotting of blood in your stool or on the toilet paper. If you underwent a bowel prep for your procedure, you may not have a normal bowel movement for a few days.  Please Note:  You might notice some irritation and congestion in your nose or some drainage.  This is from the oxygen used during your procedure.  There is no need for concern and it should clear up in a day or so.  SYMPTOMS TO REPORT IMMEDIATELY:   Following lower endoscopy (colonoscopy or flexible sigmoidoscopy):  Excessive amounts of blood in the stool  Significant tenderness or worsening of abdominal pains  Swelling of the abdomen that is new, acute  Fever of 100F or higher   Following upper endoscopy (EGD)  Vomiting of blood or coffee ground material  New chest pain or pain under the shoulder blades  Painful or persistently difficult swallowing  New shortness of breath  Fever of 100F or higher  Black, tarry-looking stools  For urgent or emergent issues, a gastroenterologist can be reached at  any hour by calling [phone].   DIET: Your first meal following the procedure should be a small meal and then it is ok to progress to your normal diet. Heavy or fried foods are harder to digest and may make you feel nauseous or bloated.  Likewise, meals heavy in dairy and vegetables can increase bloating.  Drink plenty of fluids but you should avoid alcoholic beverages for 24 hours.  ACTIVITY:  You should plan to take it easy for the rest of today and you should NOT DRIVE or use heavy machinery until tomorrow (because of the sedation medicines used during the test).    FOLLOW UP: Our staff will call the number listed on your records the next business day following your procedure to check on you and address any questions or concerns that you may have regarding the information given to you following your procedure. If we do not reach you, we will leave a message.  However, if you are feeling well and you are not experiencing any problems, there is no need to return our call.  We will assume that you have returned to your regular daily activities without incident.  If any biopsies were taken you will be contacted by phone or by letter within the next 1-3 weeks.  Please call us at [phone] if you have not heard about the biopsies in 3 weeks.    SIGNATURES/CONFIDENTIALITY: You and/or your care partner have signed paperwork which will be entered into your electronic medical record.  These signatures attest to  the fact that that the information above on your After Visit Summary has been reviewed and is understood.  Full responsibility of the confidentiality of this discharge information lies with you and/or your care-partner.

## 2015-09-01 NOTE — Progress Notes (Signed)
Transferred to recovery room. A/O x3, pleased with MAC.  VSS.  Report to Celia, RN. 

## 2015-09-01 NOTE — Progress Notes (Signed)
Called to room to assist during endoscopic procedure.  Patient ID and intended procedure confirmed with present staff. Received instructions for my participation in the procedure from the performing physician.  

## 2015-09-01 NOTE — Op Note (Signed)
[facility]  [address] [place], [address]   ENDOSCOPY PROCEDURE REPORT  PATIENT: Denise Benitez, Denise Benitez  MR#: [id number] BIRTHDATE: 1966/06/20 , 49  yrs. old GENDER: female ENDOSCOPIST: Eustace Quail, MD REFERRED BY:  Uvaldo Rising, M.D. PROCEDURE DATE:  09/01/2015 PROCEDURE:  EGD w/ biopsy ASA CLASS:     Class II INDICATIONS:  history of esophageal reflux and abdominal pain. MEDICATIONS: Monitored anesthesia care and Propofol 250 mg IV TOPICAL ANESTHETIC: none  DESCRIPTION OF PROCEDURE: After the risks benefits and alternatives of the procedure were thoroughly explained, informed consent was obtained.  The LB [id number] endoscope was introduced through the mouth and advanced to the second portion of the duodenum , Without limitations.  The instrument was slowly withdrawn as the mucosa was fully examined.  Exam:The esophagus revealed reflux esophagitis as manifested by erythema, edema, and minute erosions at the gastroesophageal junction.  Stomach revealed antral gastritis as manifested by erythema with superficial erosions.  Otherwise normal stomach.  The duodenum revealed erythema, in the bulb,but was otherwise normal. CLO biopsy taken.  Retroflexed views revealed no abnormalities. The scope was then withdrawn from the patient and the procedure completed.  COMPLICATIONS: There were no immediate complications.  ENDOSCOPIC IMPRESSION: 1. GERD with esophagitis 2. Gastroduodenitis.  CLO biopsy taken  RECOMMENDATIONS: 1.  Anti-reflux regimen to be followed 2.  Prescribe omeprazole 20 mg daily; #30; 11 refills 3.  Rx CLO if positive 4. Please contact the office to make a follow-up office appointment with Dr. Henrene Pastor in about 6-8 weeks  REPEAT EXAM:  eSigned:  Eustace Quail, MD 09/01/2015 3:13 PM    CC:The Patient and Uvaldo Rising, MD

## 2015-09-01 NOTE — Op Note (Signed)
[place]  [address] [place], [address]   COLONOSCOPY PROCEDURE REPORT  PATIENT: Denise Benitez, Denise Benitez  MR#: [id number] BIRTHDATE: 1966-01-01 , 49  yrs. old GENDER: female ENDOSCOPIST: Eustace Quail, MD REFERRED IR:JJOA Fernandez, M.D. PROCEDURE DATE:  09/01/2015 PROCEDURE:   Colonoscopy, screening and Colonoscopy with snare polypectomy X1 First Screening Colonoscopy - Avg.  risk and is 50 yrs.  old or older Yes.  Prior Negative Screening - Now for repeat screening. N/A  History of Adenoma - Now for follow-up colonoscopy & has been > or = to 3 yrs.  N/A  Polyps removed today? Yes ASA CLASS:   Class II INDICATIONS:Screening for colonic neoplasia and Colorectal Neoplasm Risk Assessment for this procedure is average risk. MEDICATIONS: Monitored anesthesia care and Propofol 300 mg IV  DESCRIPTION OF PROCEDURE:   After the risks benefits and alternatives of the procedure were thoroughly explained, informed consent was obtained.  The digital rectal exam revealed no abnormalities of the rectum.   The LB [id number]  endoscope was introduced through the anus and advanced to the cecum, which was identified by both the appendix and ileocecal valve. No adverse events experienced.   The quality of the prep was excellent. (Suprep was used)  The instrument was then slowly withdrawn as the colon was fully examined. Estimated blood loss is zero unless otherwise noted in this procedure report.    COLON FINDINGS: A single polyp measuring 5 mm in size was found in the transverse colon.  A polypectomy was performed with a cold snare.  The resection was complete, the polyp tissue was completely retrieved and sent to histology.   There was diverticulosis noted(a few scattered diverticula).   The examination was otherwise normal. Retroflexed views revealed internal hemorrhoids. The time to cecum = 3.3 Withdrawal time = 11.0   The scope was withdrawn and the procedure  completed. COMPLICATIONS: There were no immediate complications.  ENDOSCOPIC IMPRESSION: 1.   Single polyp was found in the transverse colon; polypectomy was performed with a cold snare 2.   Diverticulosis (a few scattered) 3.   The examination was otherwise normal  RECOMMENDATIONS: 1.  Repeat colonoscopy in 5 years if polyp adenomatous; otherwise 10 years 2.  Upper endoscopy today (please see report)  eSigned:  Eustace Quail, MD 09/01/2015 3:07 PM   cc: The Patient and Uvaldo Rising, MD

## 2015-09-02 ENCOUNTER — Telehealth: Payer: Self-pay | Admitting: *Deleted

## 2015-09-02 LAB — HELICOBACTER PYLORI SCREEN-BIOPSY: UREASE: NEGATIVE

## 2015-09-02 NOTE — Telephone Encounter (Signed)
Number is invalid, attempted to call mobile number under contacts. Spanish message, No message left.

## 2015-09-02 NOTE — Telephone Encounter (Deleted)
  Follow up Call-  Call back number 09/01/2015  Post procedure Call Back phone  # [phone]  Permission to leave phone message Yes     Patient questions:  Do you have a fever, pain , or abdominal swelling? {yes no:314532} Pain Score  {NUMBERS; 0-10:5044} *  Have you tolerated food without any problems? {yes no:314532}  Have you been able to return to your normal activities? {yes no:314532}  Do you have any questions about your discharge instructions: Diet   {yes no:314532} Medications  {yes no:314532} Follow up visit  {yes no:314532}  Do you have questions or concerns about your Care? {yes no:314532}  Actions: * If pain score is 4 or above: {ACTION; [facility] ENDO PAIN >4:21563::"No action needed, pain <4."}

## 2015-09-04 ENCOUNTER — Encounter: Payer: Self-pay | Admitting: Internal Medicine

## 2015-10-14 ENCOUNTER — Ambulatory Visit: Payer: BLUE CROSS/BLUE SHIELD | Admitting: Internal Medicine

## 2016-10-04 DIAGNOSIS — J00 Acute nasopharyngitis [common cold]: Secondary | ICD-10-CM | POA: Diagnosis not present

## 2016-10-04 DIAGNOSIS — J189 Pneumonia, unspecified organism: Secondary | ICD-10-CM | POA: Diagnosis not present

## 2016-12-15 ENCOUNTER — Ambulatory Visit (INDEPENDENT_AMBULATORY_CARE_PROVIDER_SITE_OTHER): Payer: BLUE CROSS/BLUE SHIELD | Admitting: Gynecology

## 2016-12-15 ENCOUNTER — Encounter: Payer: Self-pay | Admitting: Gynecology

## 2016-12-15 VITALS — BP 120/78 | Ht 65.0 in | Wt 157.0 lb

## 2016-12-15 DIAGNOSIS — E038 Other specified hypothyroidism: Secondary | ICD-10-CM | POA: Diagnosis not present

## 2016-12-15 DIAGNOSIS — N951 Menopausal and female climacteric states: Secondary | ICD-10-CM | POA: Diagnosis not present

## 2016-12-15 DIAGNOSIS — Z01411 Encounter for gynecological examination (general) (routine) with abnormal findings: Secondary | ICD-10-CM

## 2016-12-15 DIAGNOSIS — F908 Attention-deficit hyperactivity disorder, other type: Secondary | ICD-10-CM

## 2016-12-15 DIAGNOSIS — L719 Rosacea, unspecified: Secondary | ICD-10-CM

## 2016-12-15 DIAGNOSIS — F988 Other specified behavioral and emotional disorders with onset usually occurring in childhood and adolescence: Secondary | ICD-10-CM | POA: Insufficient documentation

## 2016-12-15 MED ORDER — AZELAIC ACID 15 % EX GEL: CUTANEOUS | 0 refills | Status: DC

## 2016-12-15 MED ORDER — AMPHETAMINE-DEXTROAMPHETAMINE 10 MG PO TABS: 10.0000 mg | ORAL_TABLET | Freq: Every day | ORAL | 0 refills | Status: DC

## 2016-12-15 NOTE — Progress Notes (Signed)
Denise Benitez 07-Dec-1965 CL:[id number]   History:    51 y.o.  for annual gyn exam who was requesting have prescription refill for her Adderall 10 mg which she takes on a when necessary basis for her ADD and also requesting a refill for her rosacea medicine. Patient is perimenopausal has cycles every other month but very minimal if any vasomotor symptoms. Patient with no past history of any abnormal Pap smears. Patient had a colonoscopy in 2016. She had been referred to the gastrologist back in 2016 when she had elevated liver function tests as well. She does have history of hypothyroidism has not been taking her medication she also has history of hyperlipidemia on no medication.  Past medical history,surgical history, family history and social history were all reviewed and documented in the EPIC chart.  Gynecologic History Patient's last menstrual period was 12/01/2016. Contraception: tubal ligation Last Pap: 2016. Results were: normal Last mammogram: 2016. Results were: normal  Obstetric History OB History  Gravida Para Term Preterm AB Living  2 2       2   SAB TAB Ectopic Multiple Live Births               # Outcome Date GA Lbr Len/2nd Weight Sex Delivery Anes PTL Lv  2 Para           1 Para                ROS: A ROS was performed and pertinent positives and negatives are included in the history.  GENERAL: No fevers or chills. HEENT: No change in vision, no earache, sore throat or sinus congestion. NECK: No pain or stiffness. CARDIOVASCULAR: No chest pain or pressure. No palpitations. PULMONARY: No shortness of breath, cough or wheeze. GASTROINTESTINAL: No abdominal pain, nausea, vomiting or diarrhea, melena or bright red blood per rectum. GENITOURINARY: No urinary frequency, urgency, hesitancy or dysuria. MUSCULOSKELETAL: No joint or muscle pain, no back pain, no recent trauma. DERMATOLOGIC: No rash, no itching, no lesions. ENDOCRINE: No polyuria, polydipsia, no heat or cold  intolerance. No recent change in weight. HEMATOLOGICAL: No anemia or easy bruising or bleeding. NEUROLOGIC: No headache, seizures, numbness, tingling or weakness. PSYCHIATRIC: No depression, no loss of interest in normal activity or change in sleep pattern.     Exam: chaperone present  BP 120/78   Ht 5\' 5"  (1.651 m)   Wt 157 lb (71.2 kg)   LMP 12/01/2016   BMI 26.13 kg/m   Body mass index is 26.13 kg/m.  General appearance : Well developed well nourished female. No acute distress HEENT: Eyes: no retinal hemorrhage or exudates,  Neck supple, trachea midline, no carotid bruits, no thyroidmegaly Lungs: Clear to auscultation, no rhonchi or wheezes, or rib retractions  Heart: Regular rate and rhythm, no murmurs or gallops Breast:Examined in sitting and supine position were symmetrical in appearance, no palpable masses or tenderness,  no skin retraction, no nipple inversion, no nipple discharge, no skin discoloration, no axillary or supraclavicular lymphadenopathy Abdomen: no palpable masses or tenderness, no rebound or guarding Extremities: no edema or skin discoloration or tenderness  Pelvic:  Bartholin, Urethra, Skene Glands: Within normal limits             Vagina: No gross lesions or discharge  Cervix: No gross lesions or discharge  Uterus  anteverted, normal size, shape and consistency, non-tender and mobile  Adnexa  Without masses or tenderness  Anus and perineum  normal   Rectovaginal  normal sphincter  tone without palpated masses or tenderness             Hemoccult cards will be provided     Assessment/Plan:  51 y.o. female for annual exam with past history of hypothyroidism on no medication. For this reason we'll check her TSH at time of the following screening fasting blood work: Fasting lipid profile, comprehensive metabolic panel, TSH, and CBC. Patient was provided with a requisition to schedule her overdue mammogram. Prescription refill for Adderall 10 mg 1 by mouth daily  when necessary #30 with no refills provided she was reminded to follow-up with her psychiatrist. Pap smear not done this year. Patient declined flu vaccine. We discussed importance of calcium vitamin D and weightbearing exercise for osteoporosis prevention. Because her perimenopausal status and FSH will be drawn today.   Terrance Mass MD, 3:47 PM 12/15/2016

## 2016-12-28 ENCOUNTER — Other Ambulatory Visit: Payer: BLUE CROSS/BLUE SHIELD

## 2016-12-28 DIAGNOSIS — N951 Menopausal and female climacteric states: Secondary | ICD-10-CM | POA: Diagnosis not present

## 2016-12-28 DIAGNOSIS — Z01411 Encounter for gynecological examination (general) (routine) with abnormal findings: Secondary | ICD-10-CM | POA: Diagnosis not present

## 2016-12-28 DIAGNOSIS — E038 Other specified hypothyroidism: Secondary | ICD-10-CM | POA: Diagnosis not present

## 2016-12-28 LAB — CBC WITH DIFFERENTIAL/PLATELET
BASOS ABS: 142 {cells}/uL (ref 0–200)
Basophils Relative: 2 %
EOS PCT: 9 %
Eosinophils Absolute: 639 cells/uL — ABNORMAL HIGH (ref 15–500)
HEMATOCRIT: 42.4 % (ref 35.0–45.0)
HEMOGLOBIN: 14 g/dL (ref 11.7–15.5)
LYMPHS ABS: 2059 {cells}/uL (ref 850–3900)
Lymphocytes Relative: 29 %
MCH: 30.1 pg (ref 27.0–33.0)
MCHC: 33 g/dL (ref 32.0–36.0)
MCV: 91.2 fL (ref 80.0–100.0)
MPV: 9.1 fL (ref 7.5–12.5)
Monocytes Absolute: 568 cells/uL (ref 200–950)
Monocytes Relative: 8 %
NEUTROS PCT: 52 %
Neutro Abs: 3692 cells/uL (ref 1500–7800)
Platelets: 255 10*3/uL (ref 140–400)
RBC: 4.65 MIL/uL (ref 3.80–5.10)
RDW: 14.6 % (ref 11.0–15.0)
WBC: 7.1 10*3/uL (ref 3.8–10.8)

## 2016-12-28 LAB — TSH: TSH: 3.87 mIU/L

## 2016-12-28 LAB — LIPID PANEL
CHOL/HDL RATIO: 4.7 ratio (ref <5.0)
Cholesterol: 241 mg/dL — ABNORMAL HIGH (ref <200)
HDL: 51 mg/dL (ref >50)
LDL CALC: 169 mg/dL — AB (ref <100)
TRIGLYCERIDES: 103 mg/dL (ref <150)
VLDL: 21 mg/dL (ref <30)

## 2016-12-28 LAB — COMPREHENSIVE METABOLIC PANEL
ALBUMIN: 4 g/dL (ref 3.6–5.1)
ALT: 16 U/L (ref 6–29)
AST: 17 U/L (ref 10–35)
Alkaline Phosphatase: 80 U/L (ref 33–130)
BUN: 14 mg/dL (ref 7–25)
CHLORIDE: 109 mmol/L (ref 98–110)
CO2: 23 mmol/L (ref 20–31)
Calcium: 10 mg/dL (ref 8.6–10.4)
Creat: 0.86 mg/dL (ref 0.50–1.05)
Glucose, Bld: 88 mg/dL (ref 65–99)
Potassium: 4.6 mmol/L (ref 3.5–5.3)
Sodium: 140 mmol/L (ref 135–146)
Total Bilirubin: 0.9 mg/dL (ref 0.2–1.2)
Total Protein: 7.2 g/dL (ref 6.1–8.1)

## 2016-12-29 LAB — FOLLICLE STIMULATING HORMONE: FSH: 90.6 m[IU]/mL

## 2017-01-02 ENCOUNTER — Other Ambulatory Visit: Payer: Self-pay | Admitting: Anesthesiology

## 2017-01-02 DIAGNOSIS — Z1211 Encounter for screening for malignant neoplasm of colon: Secondary | ICD-10-CM

## 2017-01-30 ENCOUNTER — Encounter: Payer: Self-pay | Admitting: Gynecology

## 2017-02-13 ENCOUNTER — Telehealth: Payer: Self-pay

## 2017-02-13 NOTE — Telephone Encounter (Signed)
Encounter already opened. 

## 2017-02-13 NOTE — Telephone Encounter (Signed)
Pharmacist informed. 

## 2017-02-13 NOTE — Telephone Encounter (Signed)
Pharmacy called because patient dropped off Rx for Adderal that was written for her in February. Pharmacist just wanted to be sure okay to go ahead and fill it now.

## 2017-02-13 NOTE — Telephone Encounter (Signed)
Yes I will be fine

## 2017-02-14 ENCOUNTER — Telehealth: Payer: Self-pay

## 2017-02-14 NOTE — Telephone Encounter (Signed)
Per Dr. Moshe Salisbury okay for gel.

## 2017-02-14 NOTE — Telephone Encounter (Signed)
This is a med you prescribed for her at her 12/15/2016 visit (Same visit as Adderall).  It is in her med list but I did not see where you mentioned it in your note.

## 2017-02-14 NOTE — Telephone Encounter (Signed)
Pharmacy notified.

## 2017-02-14 NOTE — Telephone Encounter (Signed)
Recent office visit note:  "History:    51 y.o.  for annual gyn exam who was requesting have prescription refill for her Adderall 10 mg which she takes on a when necessary basis for her ADD"

## 2017-02-14 NOTE — Telephone Encounter (Signed)
I don't know what  you are talking about she had called for her prescription refill for her Adderall yesterday. I dont even know what this product you indicated even is.?????

## 2017-02-14 NOTE — Telephone Encounter (Signed)
Pharmacy sent a note regarding Azelaic Acid 15 % cream.   "Script clarification: Written for cream. Does not come in a cream. Only a gel or a foam. Please clarify which you would like for her to have. Thanks"

## 2017-03-15 ENCOUNTER — Encounter: Payer: Self-pay | Admitting: Gynecology

## 2017-06-03 ENCOUNTER — Emergency Department (HOSPITAL_COMMUNITY)
Admission: EM | Admit: 2017-06-03 | Discharge: 2017-06-03 | Disposition: A | Discharge status: Home / Self Care | Payer: BLUE CROSS/BLUE SHIELD | Attending: Emergency Medicine | Admitting: Emergency Medicine

## 2017-06-03 ENCOUNTER — Encounter (HOSPITAL_COMMUNITY): Payer: Self-pay | Admitting: Emergency Medicine

## 2017-06-03 DIAGNOSIS — Y9301 Activity, walking, marching and hiking: Secondary | ICD-10-CM | POA: Insufficient documentation

## 2017-06-03 DIAGNOSIS — Y999 Unspecified external cause status: Secondary | ICD-10-CM | POA: Insufficient documentation

## 2017-06-03 DIAGNOSIS — S01111A Laceration without foreign body of right eyelid and periocular area, initial encounter: Secondary | ICD-10-CM | POA: Insufficient documentation

## 2017-06-03 DIAGNOSIS — F17210 Nicotine dependence, cigarettes, uncomplicated: Secondary | ICD-10-CM | POA: Insufficient documentation

## 2017-06-03 DIAGNOSIS — Y929 Unspecified place or not applicable: Secondary | ICD-10-CM | POA: Insufficient documentation

## 2017-06-03 DIAGNOSIS — W109XXA Fall (on) (from) unspecified stairs and steps, initial encounter: Secondary | ICD-10-CM | POA: Insufficient documentation

## 2017-06-03 DIAGNOSIS — J45909 Unspecified asthma, uncomplicated: Secondary | ICD-10-CM | POA: Diagnosis not present

## 2017-06-03 DIAGNOSIS — S0181XA Laceration without foreign body of other part of head, initial encounter: Secondary | ICD-10-CM

## 2017-06-03 MED ORDER — LIDOCAINE-EPINEPHRINE (PF) 2 %-1:200000 IJ SOLN
INTRAMUSCULAR | Status: AC
  Administered 2017-06-03: 20 mL
  Filled 2017-06-03: qty 20

## 2017-06-03 MED ORDER — LIDOCAINE-EPINEPHRINE (PF) 2 %-1:200000 IJ SOLN
20.0000 mL | Freq: Once | INTRAMUSCULAR | Status: AC
  Administered 2017-06-03: 20 mL

## 2017-06-03 MED ORDER — TETANUS-DIPHTH-ACELL PERTUSSIS 5-2.5-18.5 LF-MCG/0.5 IM SUSP
0.5000 mL | Freq: Once | INTRAMUSCULAR | Status: AC
  Administered 2017-06-03: 0.5 mL via INTRAMUSCULAR
  Filled 2017-06-03: qty 0.5

## 2017-06-03 MED ORDER — BACITRACIN ZINC 500 UNIT/GM EX OINT
TOPICAL_OINTMENT | CUTANEOUS | Status: AC
  Filled 2017-06-03: qty 0.9

## 2017-06-03 MED ORDER — IBUPROFEN 200 MG PO TABS
600.0000 mg | ORAL_TABLET | Freq: Once | ORAL | Status: AC
  Administered 2017-06-03: 600 mg via ORAL
  Filled 2017-06-03: qty 3

## 2017-06-03 NOTE — ED Provider Notes (Signed)
[place] DEPT Provider Note   CSN: [id number] Arrival date & time: 06/03/17  4097     History   Chief Complaint Chief Complaint  Patient presents with  . Fall  . Head Laceration    HPI Denise Benitez is a 51 y.o. female.  HPI   51 year old female presents today with complaints of head laceration. Patient reports that she was walking down the stairs when she tripped and fell and struck her head on the carpet. She notes her glasses caused a laceration to her right eyebrow. She denies any loss of consciousness, denies any neck pain, denies any neurological deficits. Patient reports she was drinking a  very small amount of alcohol last night earlier in the evening. She denies any other injuries from the fall.     Past Medical History:  Diagnosis Date  . Anxiety   . Asthma   . Bartholin gland cyst   . Colon polyp    adenomatous  . Diverticulosis   . Elevated LFTs   . GERD (gastroesophageal reflux disease)   . Hemorrhoids   . Hyperlipidemia   . Hypothyroidism   . Perimenopause   . Varicose veins     Patient Active Problem List   Diagnosis Date Noted  . Attention deficit disorder 12/15/2016  . Rosacea 12/15/2016  . Thyroid activity decreased 03/03/2015  . Hyperlipidemia 03/03/2015  . Cyst of right Bartholin's gland 03/03/2015  . Perimenopause 02/27/2015  . Bartholin's duct cyst 02/27/2015    Past Surgical History:  Procedure Laterality Date  . CHOLECYSTECTOMY  1995  . OVARIAN CYST REMOVAL Right   . PELVIC LAPAROSCOPY     ovarian cystectomy  . VEIN SURGERY  1993    OB History    Gravida Para Term Preterm AB Living   2 2       2    SAB TAB Ectopic Multiple Live Births                   Home Medications    Prior to Admission medications   Medication Sig Start Date End Date Taking? Authorizing Provider  amphetamine-dextroamphetamine (ADDERALL) 10 MG tablet Take 1 tablet (10 mg total) by mouth daily with breakfast. 12/15/16   Terrance Mass, MD    Ascorbic Acid (VITAMIN C) 1000 MG tablet Take 1,000 mg by mouth daily.    [provider]  aspirin 81 MG tablet Take 81 mg by mouth daily.    [provider]  Azelaic Acid 15 % cream After skin is thoroughly washed and patted dry, gently but thoroughly massage a thin film of azelaic acid cream into the affected area twice daily, in the morning and evening. 12/15/16   Terrance Mass, MD  BIOTIN 5000 PO Take 1 tablet by mouth daily.    [provider]  Calcium-Vitamin D 600-200 MG-UNIT per tablet Take 1 tablet by mouth daily.    [provider]  Multiple Vitamins-Minerals (HAIR/SKIN/NAILS PO) Take 1 tablet by mouth daily.    [provider]  Omega-3 Fatty Acids (FISH OIL) 1000 MG CAPS Take 1 capsule by mouth daily.    [provider]  omeprazole (PRILOSEC) 20 MG capsule Take 1 capsule (20 mg total) by mouth daily. 09/01/15   Irene Shipper, MD  vitamin E 400 UNIT capsule Take 400 Units by mouth daily.    [provider]  zolpidem (AMBIEN) 10 MG tablet Take 1 tablet (10 mg total) by mouth at bedtime as needed for  sleep. 07/21/15 11/25/15  Terrance Mass, MD    Family History Family History  Problem Relation Age of Onset  . Hypertension Father   . Liver disease Father   . Hypertension Paternal Aunt   . Stomach cancer Paternal Uncle        mets  . Hypertension Paternal Uncle   . Breast cancer Paternal Grandmother   . Hypertension Paternal Grandmother   . Colon polyps Paternal Aunt     Social History Social History  Substance Use Topics  . Smoking status: Current Every Day Smoker    Packs/day: 0.50    Types: Cigarettes  . Smokeless tobacco: Never Used  . Alcohol use 0.0 oz/week     Comment: weekends 10     Allergies   Codeine   Review of Systems Review of Systems  All other systems reviewed and are negative.  Physical Exam Updated Vital Signs BP 100/67 (BP Location: Right Arm)   Pulse (!) 113   Temp 98.3 F  (36.8 C) (Oral)   Resp 18   Ht 5' 5.5" (1.664 m)   Wt 70.3 kg (155 lb)   LMP 12/01/2016   SpO2 96%   BMI 25.40 kg/m   Physical Exam  Constitutional: She is oriented to person, place, and time. She appears well-developed and well-nourished.  HENT:  Head: Normocephalic.  1 cm laceration to right eyebrow, no surrounding bony abnormality  Eyes: Pupils are equal, round, and reactive to light. Conjunctivae are normal. Right eye exhibits no discharge. Left eye exhibits no discharge. No scleral icterus.  Neck: Normal range of motion. No JVD present. No tracheal deviation present.  Pulmonary/Chest: Effort normal. No stridor.  Musculoskeletal:  Neck supple full active range of motion and no C or T-spine tenderness to palpation  Neurological: She is alert and oriented to person, place, and time. Coordination normal.  Psychiatric: She has a normal mood and affect. Her behavior is normal. Judgment and thought content normal.  Nursing note and vitals reviewed.   ED Treatments / Results  Labs (all labs ordered are listed, but only abnormal results are displayed) Labs Reviewed - No data to display  EKG  EKG Interpretation None       Radiology No results found.  Procedures .Marland KitchenLaceration Repair Date/Time: 06/03/2017 6:42 AM Performed by: Penni Bombard, Atharv Barriere Authorized by: Penni Bombard, Hanson Medeiros   Consent:    Alternatives discussed:  No treatment Anesthesia (see MAR for exact dosages):    Anesthesia method:  Local infiltration   Local anesthetic:  Lidocaine 1% WITH epi Laceration details:    Location:  Face   Face location:  R eyebrow   Length (cm):  1 Pre-procedure details:    Preparation:  Patient was prepped and draped in usual sterile fashion Exploration:    Wound extent: no areolar tissue violation noted, no fascia violation noted, no foreign bodies/material noted, no muscle damage noted, no nerve damage noted, no tendon damage noted, no underlying fracture noted and no vascular damage  noted     Contaminated: no   Treatment:    Area cleansed with:  Saline   Amount of cleaning:  Standard   Irrigation solution:  Sterile saline   Visualized foreign bodies/material removed: no   Skin repair:    Repair method:  Sutures   Suture size:  5-0   Suture material:  Fast-absorbing gut   Number of sutures:  5 Approximation:    Approximation:  Close   Vermilion border: well-aligned   Post-procedure details:  Dressing:  Antibiotic ointment and adhesive bandage   Patient tolerance of procedure:  Tolerated well, no immediate complications   (including critical care time)  Medications Ordered in ED Medications  ibuprofen (ADVIL,MOTRIN) tablet 600 mg (not administered)  lidocaine-EPINEPHrine (XYLOCAINE W/EPI) 2 %-1:200000 (PF) injection 20 mL (20 mLs Infiltration Given 06/03/17 0554)  Tdap (BOOSTRIX) injection 0.5 mL (0.5 mLs Intramuscular Given 06/03/17 0554)     Initial Impression / Assessment and Plan / ED Course  I have reviewed the triage vital signs and the nursing notes.  Pertinent labs & imaging results that were available during my care of the patient were reviewed by me and considered in my medical decision making (see chart for details).      Final Clinical Impressions(s) / ED Diagnoses   Final diagnoses:  Facial laceration, initial encounter    Assessment/Plan: 51 year old female presents status post fall. This is mechanical fall she struck her head causing her eye glasses to lacerate her eyebrow. No significant bony abnormality, no loss of consciousness, no neurological deficits. Patient reports a small amount of alcohol last night, is clinically sober here and does not smell of alcohol. Patient had laceration repaired here without complication. Tetanus updated. Wound care instructions given, strict return precautions given. She verbalized understanding and agreement to today's plan had no further questions or concerns at time of discharge.   New  Prescriptions New Prescriptions   No medications on file     Francee Gentile 06/03/17 [place], Delice Bison, DO 06/03/17 [phone]

## 2017-06-03 NOTE — ED Triage Notes (Signed)
Pt states she fell down 3 to 4 steps and has a laceration noted to the right side of her forehead  Denies LOC  NO other injury to note

## 2017-06-03 NOTE — Discharge Instructions (Signed)
Please read attached information. If you experience any new or worsening signs or symptoms please return to the emergency room for evaluation. Please follow-up with your primary care provider or specialist as discussed.  °

## 2017-12-18 ENCOUNTER — Encounter: Payer: BLUE CROSS/BLUE SHIELD | Admitting: Obstetrics & Gynecology

## 2018-03-05 ENCOUNTER — Ambulatory Visit: Payer: BLUE CROSS/BLUE SHIELD | Admitting: Obstetrics & Gynecology

## 2018-03-05 ENCOUNTER — Encounter: Payer: Self-pay | Admitting: Obstetrics & Gynecology

## 2018-03-05 VITALS — BP 108/70 | Ht 64.75 in | Wt 147.0 lb

## 2018-03-05 DIAGNOSIS — Z01419 Encounter for gynecological examination (general) (routine) without abnormal findings: Secondary | ICD-10-CM | POA: Diagnosis not present

## 2018-03-05 DIAGNOSIS — F908 Attention-deficit hyperactivity disorder, other type: Secondary | ICD-10-CM | POA: Diagnosis not present

## 2018-03-05 DIAGNOSIS — R229 Localized swelling, mass and lump, unspecified: Secondary | ICD-10-CM | POA: Diagnosis not present

## 2018-03-05 DIAGNOSIS — Z780 Asymptomatic menopausal state: Secondary | ICD-10-CM | POA: Diagnosis not present

## 2018-03-05 DIAGNOSIS — Z13820 Encounter for screening for osteoporosis: Secondary | ICD-10-CM | POA: Diagnosis not present

## 2018-03-05 MED ORDER — AMPHETAMINE-DEXTROAMPHETAMINE 10 MG PO TABS: 10.0000 mg | ORAL_TABLET | Freq: Every day | ORAL | 0 refills | Status: DC

## 2018-03-05 NOTE — Progress Notes (Signed)
Denise Benitez Jun 12, 1966 [id number]   History:    52 y.o. G2P2L2 Married  S/P Tubal Ligation  RP:  Established patient presenting for annual gyn exam   HPI: Postmenopausal well on no hormone replacement therapy.  No postmenopausal bleeding.  No pelvic pain.  Normal vaginal secretions.  No pain with intercourse.  Urine and bowel movements normal.  Breasts normal.  Health labs with family physician.  Colonoscopy 2016.  Body mass index 24.65.  Cigarette smoker half a pack a day.  Past medical history,surgical history, family history and social history were all reviewed and documented in the EPIC chart.  Gynecologic History Patient's last menstrual period was 12/01/2016. Contraception: tubal ligation Last Pap: 01/2015. Results were: Negative, HPV HR neg Last mammogram: 03/2015. Results were: Negative Bone Density: Never Colonoscopy: 2016  Obstetric History OB History  Gravida Para Term Preterm AB Living  2 2       2   SAB TAB Ectopic Multiple Live Births               # Outcome Date GA Lbr Len/2nd Weight Sex Delivery Anes PTL Lv  2 Para           1 Para              ROS: A ROS was performed and pertinent positives and negatives are included in the history.  GENERAL: No fevers or chills. HEENT: No change in vision, no earache, sore throat or sinus congestion. NECK: No pain or stiffness. CARDIOVASCULAR: No chest pain or pressure. No palpitations. PULMONARY: No shortness of breath, cough or wheeze. GASTROINTESTINAL: No abdominal pain, nausea, vomiting or diarrhea, melena or bright red blood per rectum. GENITOURINARY: No urinary frequency, urgency, hesitancy or dysuria. MUSCULOSKELETAL: No joint or muscle pain, no back pain, no recent trauma. DERMATOLOGIC: No rash, no itching, no lesions. ENDOCRINE: No polyuria, polydipsia, no heat or cold intolerance. No recent change in weight. HEMATOLOGICAL: No anemia or easy bruising or bleeding. NEUROLOGIC: No headache, seizures, numbness, tingling  or weakness. PSYCHIATRIC: No depression, no loss of interest in normal activity or change in sleep pattern.     Exam:   Ht 5' 4.75" (1.645 m)   Wt 147 lb (66.7 kg)   LMP 12/01/2016   BMI 24.65 kg/m   Body mass index is 24.65 kg/m.  General appearance : Well developed well nourished female. No acute distress Base of neck at back: Subcutaneous mass 4 x 5 cm HEENT: Eyes: no retinal hemorrhage or exudates,  Neck supple, trachea midline, no carotid bruits, no thyroidmegaly Lungs: Clear to auscultation, no rhonchi or wheezes, or rib retractions  Heart: Regular rate and rhythm, no murmurs or gallops Breast:Examined in sitting and supine position were symmetrical in appearance, no palpable masses or tenderness,  no skin retraction, no nipple inversion, no nipple discharge, no skin discoloration, no axillary or supraclavicular lymphadenopathy Abdomen: no palpable masses or tenderness, no rebound or guarding Extremities: no edema or skin discoloration or tenderness  Pelvic: Vulva: Normal             Vagina: No gross lesions or discharge  Cervix: No gross lesions or discharge.  Pap reflex done.  Uterus  AV, normal size, shape and consistency, non-tender and mobile  Adnexa  Without masses or tenderness  Anus: Normal   Assessment/Plan:  52 y.o. female for annual exam   1. Encounter for routine gynecological examination with Papanicolaou smear of cervix Normal gynecologic exam.  Pap reflex done today.  Breast exam normal.  Patient will schedule a screening mammogram.  Health labs with family physician.  Colonoscopy 2016. Recommend smoking cessation.  2. Menopause present Well on no hormone replacement therapy.  No postmenopausal bleeding.  3. Screening for osteoporosis Vitamin D supplements, calcium rich nutrition and regular weightbearing physical activity recommended.  Patient will schedule bone density here. - Bone Density  4. Subcutaneous mass Subcutaneous mass at upper back/lower  neck. Probable lipoma, but slightly more firm than usual.  Referred to General Surgery.  5. Attention deficit hyperactivity disorder (ADHD), other type Adderall represcribed, but patient recommended to set a follow up appointment with her Psychiatrist.  Other orders - amphetamine-dextroamphetamine (ADDERALL) 10 MG tablet; Take 1 tablet (10 mg total) by mouth daily with breakfast.  Counseling on above issues and coordination of care more than 50% for 10 minutes.  Princess Bruins MD, 2:42 PM 03/05/2018

## 2018-03-06 ENCOUNTER — Telehealth: Payer: Self-pay | Admitting: *Deleted

## 2018-03-06 ENCOUNTER — Other Ambulatory Visit: Payer: Self-pay | Admitting: Gynecology

## 2018-03-06 DIAGNOSIS — Z01419 Encounter for gynecological examination (general) (routine) without abnormal findings: Secondary | ICD-10-CM | POA: Diagnosis not present

## 2018-03-06 DIAGNOSIS — Z13820 Encounter for screening for osteoporosis: Secondary | ICD-10-CM

## 2018-03-06 NOTE — Telephone Encounter (Signed)
Sent message to [facility] referral coordinator to schedule and let me know time and date.

## 2018-03-06 NOTE — Telephone Encounter (Signed)
-----   Message from Princess Bruins, MD sent at 03/05/2018  3:13 PM EDT ----- Regarding: Refer to general surgery Subcutaneous mass at upper back/lower neck.  Probable lipoma, but slightly more firm than usual.

## 2018-03-07 LAB — PAP IG W/ RFLX HPV ASCU

## 2018-03-08 NOTE — Telephone Encounter (Signed)
Dr.Hoxworth on 03/16/18 at 9:15am at [facility]

## 2018-03-09 ENCOUNTER — Encounter: Payer: Self-pay | Admitting: Obstetrics & Gynecology

## 2018-03-09 NOTE — Patient Instructions (Signed)
1. Encounter for routine gynecological examination with Papanicolaou smear of cervix Normal gynecologic exam.  Pap reflex done today.  Breast exam normal.  Patient will schedule a screening mammogram.  Health labs with family physician.  Colonoscopy 2016.  Recommend smoking cessation.  2. Menopause present Well on no hormone replacement therapy.  No postmenopausal bleeding.  3. Screening for osteoporosis Vitamin D supplements, calcium rich nutrition and regular weightbearing physical activity recommended.  Patient will schedule bone density here. - Bone Density  4. Subcutaneous mass Subcutaneous mass at upper back/lower neck. Probable lipoma, but slightly more firm than usual.  Referred to General Surgery.  5. Attention deficit hyperactivity disorder (ADHD), other type Adderall represcribed, but patient recommended to set a follow up appointment with her Psychiatrist.  Other orders - amphetamine-dextroamphetamine (ADDERALL) 10 MG tablet; Take 1 tablet (10 mg total) by mouth daily with breakfast.  Charlett Nose, it was a pleasure meeting you today!  I will inform you of your results as soon as they are available.

## 2018-03-16 DIAGNOSIS — D170 Benign lipomatous neoplasm of skin and subcutaneous tissue of head, face and neck: Secondary | ICD-10-CM | POA: Diagnosis not present

## 2018-03-20 ENCOUNTER — Other Ambulatory Visit: Payer: BLUE CROSS/BLUE SHIELD

## 2018-03-20 ENCOUNTER — Ambulatory Visit (INDEPENDENT_AMBULATORY_CARE_PROVIDER_SITE_OTHER): Payer: BLUE CROSS/BLUE SHIELD

## 2018-03-20 DIAGNOSIS — Z13820 Encounter for screening for osteoporosis: Secondary | ICD-10-CM

## 2018-03-27 ENCOUNTER — Encounter: Payer: Self-pay | Admitting: Gynecology

## 2019-01-09 ENCOUNTER — Encounter (HOSPITAL_COMMUNITY): Payer: Self-pay | Admitting: Emergency Medicine

## 2019-01-09 ENCOUNTER — Emergency Department (HOSPITAL_COMMUNITY): Payer: BLUE CROSS/BLUE SHIELD

## 2019-01-09 ENCOUNTER — Emergency Department (HOSPITAL_COMMUNITY)
Admission: EM | Admit: 2019-01-09 | Discharge: 2019-01-09 | Disposition: A | Discharge status: Home / Self Care | Payer: BLUE CROSS/BLUE SHIELD | Attending: Emergency Medicine | Admitting: Emergency Medicine

## 2019-01-09 ENCOUNTER — Other Ambulatory Visit: Payer: Self-pay

## 2019-01-09 DIAGNOSIS — J181 Lobar pneumonia, unspecified organism: Secondary | ICD-10-CM | POA: Insufficient documentation

## 2019-01-09 DIAGNOSIS — Z7982 Long term (current) use of aspirin: Secondary | ICD-10-CM | POA: Diagnosis not present

## 2019-01-09 DIAGNOSIS — Z79899 Other long term (current) drug therapy: Secondary | ICD-10-CM | POA: Insufficient documentation

## 2019-01-09 DIAGNOSIS — F17210 Nicotine dependence, cigarettes, uncomplicated: Secondary | ICD-10-CM | POA: Insufficient documentation

## 2019-01-09 DIAGNOSIS — J45909 Unspecified asthma, uncomplicated: Secondary | ICD-10-CM | POA: Diagnosis not present

## 2019-01-09 DIAGNOSIS — E039 Hypothyroidism, unspecified: Secondary | ICD-10-CM | POA: Insufficient documentation

## 2019-01-09 DIAGNOSIS — R509 Fever, unspecified: Secondary | ICD-10-CM | POA: Diagnosis not present

## 2019-01-09 DIAGNOSIS — J189 Pneumonia, unspecified organism: Secondary | ICD-10-CM

## 2019-01-09 LAB — CBC WITH DIFFERENTIAL/PLATELET
Abs Immature Granulocytes: 0.03 10*3/uL (ref 0.00–0.07)
Basophils Absolute: 0.1 10*3/uL (ref 0.0–0.1)
Basophils Relative: 1 %
EOS ABS: 0.1 10*3/uL (ref 0.0–0.5)
Eosinophils Relative: 1 %
HEMATOCRIT: 38.9 % (ref 36.0–46.0)
Hemoglobin: 12.5 g/dL (ref 12.0–15.0)
Immature Granulocytes: 0 %
LYMPHS ABS: 1.4 10*3/uL (ref 0.7–4.0)
Lymphocytes Relative: 15 %
MCH: 28.8 pg (ref 26.0–34.0)
MCHC: 32.1 g/dL (ref 30.0–36.0)
MCV: 89.6 fL (ref 80.0–100.0)
Monocytes Absolute: 0.8 10*3/uL (ref 0.1–1.0)
Monocytes Relative: 9 %
Neutro Abs: 7 10*3/uL (ref 1.7–7.7)
Neutrophils Relative %: 74 %
Platelets: 172 10*3/uL (ref 150–400)
RBC: 4.34 MIL/uL (ref 3.87–5.11)
RDW: 13.8 % (ref 11.5–15.5)
WBC: 9.4 10*3/uL (ref 4.0–10.5)
nRBC: 0 % (ref 0.0–0.2)

## 2019-01-09 LAB — I-STAT BETA HCG BLOOD, ED (MC, WL, AP ONLY): I-stat hCG, quantitative: <5 m[IU]/mL (ref <5)

## 2019-01-09 LAB — COMPREHENSIVE METABOLIC PANEL
ALT: 18 U/L (ref 0–44)
AST: 17 U/L (ref 15–41)
Albumin: 3.7 g/dL (ref 3.5–5.0)
Alkaline Phosphatase: 103 U/L (ref 38–126)
Anion gap: 8 (ref 5–15)
BUN: 14 mg/dL (ref 6–20)
CO2: 22 mmol/L (ref 22–32)
Calcium: 8.9 mg/dL (ref 8.9–10.3)
Chloride: 104 mmol/L (ref 98–111)
Creatinine, Ser: 0.7 mg/dL (ref 0.44–1.00)
GFR calc non Af Amer: >60 mL/min (ref >60)
Glucose, Bld: 113 mg/dL — ABNORMAL HIGH (ref 70–99)
Potassium: 3.8 mmol/L (ref 3.5–5.1)
Sodium: 134 mmol/L — ABNORMAL LOW (ref 135–145)
Total Bilirubin: 0.7 mg/dL (ref 0.3–1.2)
Total Protein: 7.5 g/dL (ref 6.5–8.1)

## 2019-01-09 LAB — I-STAT TROPONIN, ED: Troponin i, poc: 0.01 ng/mL (ref 0.00–0.08)

## 2019-01-09 LAB — URINALYSIS, ROUTINE W REFLEX MICROSCOPIC
Bilirubin Urine: NEGATIVE
Glucose, UA: NEGATIVE mg/dL
Hgb urine dipstick: NEGATIVE
Ketones, ur: NEGATIVE mg/dL
Leukocytes,Ua: NEGATIVE
Nitrite: NEGATIVE
Protein, ur: NEGATIVE mg/dL
Specific Gravity, Urine: 1.014 (ref 1.005–1.030)
pH: 6 (ref 5.0–8.0)

## 2019-01-09 LAB — INFLUENZA PANEL BY PCR (TYPE A & B)
Influenza A By PCR: NEGATIVE
Influenza B By PCR: NEGATIVE

## 2019-01-09 LAB — LACTIC ACID, PLASMA: Lactic Acid, Venous: 0.6 mmol/L (ref 0.5–1.9)

## 2019-01-09 MED ORDER — AMOXICILLIN 500 MG PO CAPS: 1000.0000 mg | ORAL_CAPSULE | Freq: Three times a day (TID) | ORAL | 0 refills | Status: DC

## 2019-01-09 MED ORDER — SODIUM CHLORIDE 0.9 % IV BOLUS (SEPSIS)
1000.0000 mL | Freq: Once | INTRAVENOUS | Status: AC
  Administered 2019-01-09: 1000 mL via INTRAVENOUS

## 2019-01-09 MED ORDER — AZITHROMYCIN 500 MG IV SOLR
500.0000 mg | Freq: Once | INTRAVENOUS | Status: AC
  Administered 2019-01-09: 500 mg via INTRAVENOUS
  Filled 2019-01-09: qty 500

## 2019-01-09 MED ORDER — AZITHROMYCIN 250 MG PO TABS: 250.0000 mg | ORAL_TABLET | Freq: Every day | ORAL | 0 refills | Status: DC

## 2019-01-09 MED ORDER — SODIUM CHLORIDE 0.9% FLUSH
3.0000 mL | Freq: Once | INTRAVENOUS | Status: AC
  Administered 2019-01-09: 3 mL via INTRAVENOUS

## 2019-01-09 MED ORDER — SODIUM CHLORIDE 0.9 % IV SOLN
1.0000 g | Freq: Once | INTRAVENOUS | Status: AC
  Administered 2019-01-09: 1 g via INTRAVENOUS
  Filled 2019-01-09: qty 10

## 2019-01-09 MED ORDER — ACETAMINOPHEN 500 MG PO TABS
1000.0000 mg | ORAL_TABLET | Freq: Once | ORAL | Status: AC
  Administered 2019-01-09: 1000 mg via ORAL
  Filled 2019-01-09: qty 2

## 2019-01-09 MED ORDER — IBUPROFEN 800 MG PO TABS
800.0000 mg | ORAL_TABLET | Freq: Once | ORAL | Status: AC
  Administered 2019-01-09: 800 mg via ORAL
  Filled 2019-01-09: qty 1

## 2019-01-09 NOTE — ED Provider Notes (Signed)
TIME SEEN: 3:44 AM  CHIEF COMPLAINT: Fever, cough, chest pain, shortness of breath  HPI: Patient is a 53 year old female with history of hyperlipidemia, hypothyroidism, asthma who presents to the emergency department with complaints of fever that started yesterday, productive cough, central chest pain that is worse with coughing, shortness of breath.  Has been alternating DayQuil and NyQuil at home.  Multiple sick contacts.  Did not have a flu shot this year.  No recent travel.  No vomiting or diarrhea.  No history of PE, DVT, exogenous estrogen use, recent fractures, surgery, trauma, hospitalization or prolonged travel. No lower extremity swelling or pain. No calf tenderness.    ROS: See HPI Constitutional: fever  Eyes: no drainage  ENT: no runny nose   Cardiovascular:  chest pain  Resp: SOB  GI: no vomiting GU: no dysuria Integumentary: no rash  Allergy: no hives  Musculoskeletal: no leg swelling  Neurological: no slurred speech ROS otherwise negative  PAST MEDICAL HISTORY/PAST SURGICAL HISTORY:  Past Medical History:  Diagnosis Date  . Anxiety   . Asthma   . Bartholin gland cyst   . Colon polyp    adenomatous  . Diverticulosis   . Elevated LFTs   . GERD (gastroesophageal reflux disease)   . Hemorrhoids   . Hyperlipidemia   . Hypothyroidism   . Perimenopause   . Varicose veins     MEDICATIONS:  Prior to Admission medications   Medication Sig Start Date End Date Taking? Authorizing Provider  amphetamine-dextroamphetamine (ADDERALL) 10 MG tablet Take 1 tablet (10 mg total) by mouth daily with breakfast. 03/05/18   Princess Bruins, MD  Ascorbic Acid (VITAMIN C) 1000 MG tablet Take 1,000 mg by mouth daily.    [provider]  aspirin 81 MG tablet Take 81 mg by mouth daily.    [provider]  Azelaic Acid 15 % cream After skin is thoroughly washed and patted dry, gently but thoroughly massage a thin film of azelaic acid cream into the affected area twice  daily, in the morning and evening. 12/15/16   Terrance Mass, MD  BIOTIN 5000 PO Take 1 tablet by mouth daily.    [provider]  Calcium-Vitamin D 600-200 MG-UNIT per tablet Take 1 tablet by mouth daily.    [provider]  Melatonin 1 MG CAPS Take by mouth.    [provider]  Multiple Vitamins-Minerals (HAIR/SKIN/NAILS PO) Take 1 tablet by mouth daily.    [provider]  Omega-3 Fatty Acids (FISH OIL) 1000 MG CAPS Take 1 capsule by mouth daily.    [provider]  omeprazole (PRILOSEC) 20 MG capsule Take 1 capsule (20 mg total) by mouth daily. 09/01/15   Irene Shipper, MD  vitamin E 400 UNIT capsule Take 400 Units by mouth daily.    [provider]    ALLERGIES:  Allergies  Allergen Reactions  . Codeine Itching    SOCIAL HISTORY:  Social History   Tobacco Use  . Smoking status: Current Every Day Smoker    Packs/day: 0.50    Types: Cigarettes  . Smokeless tobacco: Never Used  Substance Use Topics  . Alcohol use: Yes    Alcohol/week: 0.0 standard drinks    Comment: weekends 10    FAMILY HISTORY: Family History  Problem Relation Age of Onset  . Hypertension Father   . Liver disease Father   . Hypertension Paternal Aunt   . Stomach cancer Paternal Uncle  mets  . Hypertension Paternal Uncle   . Breast cancer Paternal Grandmother   . Hypertension Paternal Grandmother   . Colon polyps Paternal Aunt     EXAM: BP (!) 155/90 (BP Location: Left Arm)   Pulse (!) 129   Temp (!) 102.5 F (39.2 C) (Oral)   Resp 19   Ht 5' 5.5" (1.664 m)   Wt 65.8 kg   LMP 12/01/2016   SpO2 94%   BMI 23.76 kg/m  CONSTITUTIONAL: Alert and oriented and responds appropriately to questions. Well-appearing; well-nourished HEAD: Normocephalic EYES: Conjunctivae clear, pupils appear equal, EOMI ENT: normal nose; moist mucous membranes NECK: Supple, no meningismus, no nuchal rigidity, no LAD  CARD: Regular and tachycardic; S1 and  S2 appreciated; no murmurs, no clicks, no rubs, no gallops RESP: Normal chest excursion without splinting or tachypnea; breath sounds clear and equal bilaterally; no wheezes, no rhonchi, no rales, no hypoxia or respiratory distress, speaking full sentences ABD/GI: Normal bowel sounds; non-distended; soft, non-tender, no rebound, no guarding, no peritoneal signs, no hepatosplenomegaly BACK:  The back appears normal and is non-tender to palpation, there is no CVA tenderness EXT: Normal ROM in all joints; non-tender to palpation; no edema; normal capillary refill; no cyanosis, no calf tenderness or swelling    SKIN: Normal color for age and race; warm; no rash NEURO: Moves all extremities equally PSYCH: The patient's mood and manner are appropriate. Grooming and personal hygiene are appropriate.  MEDICAL DECISION MAKING: Patient here with fevers, cough.  Chest x-ray shows pneumonia.  Will treat for community-acquired pneumonia.  She is febrile and tachycardic here but does not appear septic, toxic.  Will obtain labs, cultures, flu swab.  Will give IV fluids, Tylenol, IV antibiotics.  Low suspicion for ACS, PE.  Suspect her chest pain is secondary to her pneumonia.  ED PROGRESS: Patient has had Pacitti's in the left upper lobe and left lower lobe compatible with pneumonia.  Labs unremarkable.  No leukocytosis.  Normal lactate.  Negative troponin.  Flu swabs negative.  Patient getting IV ceftriaxone and azithromycin as well as fluids.  Heart rate slowly improving is temperature coming down.  Have updated her with these results.  She states she is feeling much better and would like discharge home.  Will reassess once IV fluids, medications complete.   6:50 AM  Pt's vital signs continue to improve and she continues to express desire for discharge home.  Will discharge with amoxicillin, azithromycin.  Discussed supportive care instructions and return precautions.  She verbalized understanding and is  comfortable with this plan.   At this time, I do not feel there is any life-threatening condition present. I have reviewed and discussed all results (EKG, imaging, lab, urine as appropriate) and exam findings with patient/family. I have reviewed nursing notes and appropriate previous records.  I feel the patient is safe to be discharged home without further emergent workup and can continue workup as an outpatient as needed. Discussed usual and customary return precautions. Patient/family verbalize understanding and are comfortable with this plan.  Outpatient follow-up has been provided as needed. All questions have been answered.     EKG Interpretation  Date/Time:  Wednesday January 09 2019 03:03:08 EDT Ventricular Rate:  121 PR Interval:    QRS Duration: 90 QT Interval:  335 QTC Calculation: 476 R Axis:   92 Text Interpretation:  Sinus tachycardia Ventricular premature complex Aberrant conduction of SV complex(es) LAE, consider biatrial enlargement Borderline right axis deviation Borderline T wave abnormalities No old  tracing to compare Confirmed by Ondine Gemme, Cyril Mourning [phone]) on 01/09/2019 3:09:39 AM         CRITICAL CARE Performed by: Cyril Mourning Deniz Eskridge   Total critical care time: 65 minutes  Critical care time was exclusive of separately billable procedures and treating other patients.  Critical care was necessary to treat or prevent imminent or life-threatening deterioration.  Critical care was time spent personally by me on the following activities: development of treatment plan with patient and/or surrogate as well as nursing, discussions with consultants, evaluation of patient's response to treatment, examination of patient, obtaining history from patient or surrogate, ordering and performing treatments and interventions, ordering and review of laboratory studies, ordering and review of radiographic studies, pulse oximetry and re-evaluation of patient's condition.    Vincenza Dail, Delice Bison,  DO 01/09/19 [phone]

## 2019-01-09 NOTE — ED Notes (Signed)
Pt ambulated to the bathroom with no assistance. Gait steady  

## 2019-01-09 NOTE — ED Notes (Addendum)
Pt given water per MD  

## 2019-01-09 NOTE — ED Triage Notes (Signed)
Patient states that she is having mid chest pain. Patient states that she is also having sob and a cough. Patient has a fever.

## 2019-01-09 NOTE — ED Notes (Signed)
Urine and culture sent to lab  

## 2019-01-09 NOTE — Discharge Instructions (Addendum)
You may alternate Tylenol 1000 mg every 6 hours as needed for pain and fever and Ibuprofen 800 mg every 8 hours as needed for pain and fever.  Please take Ibuprofen with food.  You may use over-the-counter dextromethorphan and guaifenesin as needed for cough.

## 2019-01-09 NOTE — ED Notes (Signed)
Pt and husband verbalized discharge instructions and follow up care. Alert and ambulatory. IVs removed. Husband is driving pt home

## 2019-01-14 LAB — CULTURE, BLOOD (ROUTINE X 2)
Culture: NO GROWTH
Culture: NO GROWTH
Special Requests: ADEQUATE

## 2019-02-25 DIAGNOSIS — M940 Chondrocostal junction syndrome [Tietze]: Secondary | ICD-10-CM | POA: Diagnosis not present

## 2019-05-27 ENCOUNTER — Telehealth: Payer: Self-pay | Admitting: General Practice

## 2019-05-27 NOTE — Telephone Encounter (Signed)
Patient is calling for approval for a new patient for Dr. Larose Kells. [place] Patients friend asked if Dr Larose Kells. would take her on as a new patient. Per Barnie Alderman, Dr Larose Kells said ok.  Is this still ok? Please call with an appt Please advise CB- [phone]

## 2019-05-27 NOTE — Telephone Encounter (Signed)
Schedule pt new pt appt on 06-05-2019. Done.

## 2019-05-27 NOTE — Telephone Encounter (Signed)
Okay to schedule NP appt at her convenience.  

## 2019-06-05 ENCOUNTER — Other Ambulatory Visit: Payer: Self-pay

## 2019-06-05 ENCOUNTER — Ambulatory Visit: Payer: BC Managed Care – PPO | Admitting: Internal Medicine

## 2019-06-05 ENCOUNTER — Encounter: Payer: Self-pay | Admitting: Internal Medicine

## 2019-06-05 ENCOUNTER — Ambulatory Visit (HOSPITAL_BASED_OUTPATIENT_CLINIC_OR_DEPARTMENT_OTHER)
Admission: RE | Admit: 2019-06-05 | Discharge: 2019-06-05 | Disposition: A | Discharge status: Home / Self Care | Payer: BC Managed Care – PPO | Source: Ambulatory Visit | Attending: Internal Medicine | Admitting: Internal Medicine

## 2019-06-05 VITALS — BP 129/86 | HR 99 | Temp 98.3°F | Resp 16 | Ht 65.0 in | Wt 143.0 lb

## 2019-06-05 DIAGNOSIS — F329 Major depressive disorder, single episode, unspecified: Secondary | ICD-10-CM

## 2019-06-05 DIAGNOSIS — Z720 Tobacco use: Secondary | ICD-10-CM | POA: Diagnosis not present

## 2019-06-05 DIAGNOSIS — J90 Pleural effusion, not elsewhere classified: Secondary | ICD-10-CM

## 2019-06-05 DIAGNOSIS — R9389 Abnormal findings on diagnostic imaging of other specified body structures: Secondary | ICD-10-CM

## 2019-06-05 DIAGNOSIS — F32A Depression, unspecified: Secondary | ICD-10-CM

## 2019-06-05 DIAGNOSIS — R0602 Shortness of breath: Secondary | ICD-10-CM | POA: Diagnosis not present

## 2019-06-05 DIAGNOSIS — F419 Anxiety disorder, unspecified: Secondary | ICD-10-CM

## 2019-06-05 DIAGNOSIS — R0600 Dyspnea, unspecified: Secondary | ICD-10-CM

## 2019-06-05 DIAGNOSIS — R0609 Other forms of dyspnea: Secondary | ICD-10-CM

## 2019-06-05 LAB — COMPREHENSIVE METABOLIC PANEL
ALT: 20 U/L (ref 0–35)
AST: 19 U/L (ref 0–37)
Albumin: 4.1 g/dL (ref 3.5–5.2)
Alkaline Phosphatase: 138 U/L — ABNORMAL HIGH (ref 39–117)
BUN: 16 mg/dL (ref 6–23)
CO2: 28 mEq/L (ref 19–32)
Calcium: 10.2 mg/dL (ref 8.4–10.5)
Chloride: 99 mEq/L (ref 96–112)
Creatinine, Ser: 0.75 mg/dL (ref 0.40–1.20)
GFR: 80.06 mL/min (ref >60.00)
Glucose, Bld: 93 mg/dL (ref 70–99)
Potassium: 5.2 mEq/L — ABNORMAL HIGH (ref 3.5–5.1)
Sodium: 134 mEq/L — ABNORMAL LOW (ref 135–145)
Total Bilirubin: 1.2 mg/dL (ref 0.2–1.2)
Total Protein: 7.5 g/dL (ref 6.0–8.3)

## 2019-06-05 LAB — CBC WITH DIFFERENTIAL/PLATELET
Basophils Absolute: 0.1 10*3/uL (ref 0.0–0.1)
Basophils Relative: 1.3 % (ref 0.0–3.0)
Eosinophils Absolute: 0.5 10*3/uL (ref 0.0–0.7)
Eosinophils Relative: 6.3 % — ABNORMAL HIGH (ref 0.0–5.0)
HCT: 38.9 % (ref 36.0–46.0)
Hemoglobin: 12.7 g/dL (ref 12.0–15.0)
Lymphocytes Relative: 15.4 % (ref 12.0–46.0)
Lymphs Abs: 1.3 10*3/uL (ref 0.7–4.0)
MCHC: 32.6 g/dL (ref 30.0–36.0)
MCV: 89.1 fl (ref 78.0–100.0)
Monocytes Absolute: 0.8 10*3/uL (ref 0.1–1.0)
Monocytes Relative: 9.6 % (ref 3.0–12.0)
Neutro Abs: 5.8 10*3/uL (ref 1.4–7.7)
Neutrophils Relative %: 67.4 % (ref 43.0–77.0)
Platelets: 361 10*3/uL (ref 150.0–400.0)
RBC: 4.37 Mil/uL (ref 3.87–5.11)
RDW: 13.7 % (ref 11.5–15.5)
WBC: 8.6 10*3/uL (ref 4.0–10.5)

## 2019-06-05 LAB — T4, FREE: Free T4: 1 ng/dL (ref 0.60–1.60)

## 2019-06-05 LAB — T3, FREE: T3, Free: 3.5 pg/mL (ref 2.3–4.2)

## 2019-06-05 LAB — TSH: TSH: 3.36 u[IU]/mL (ref 0.35–4.50)

## 2019-06-05 MED ORDER — ESCITALOPRAM OXALATE 10 MG PO TABS: 10.0000 mg | ORAL_TABLET | Freq: Every day | ORAL | 1 refills | Status: DC

## 2019-06-05 NOTE — Patient Instructions (Signed)
GO TO THE LAB : Get the blood work     GO TO THE FRONT DESK Schedule your next appointment for a checkup in 2 weeks    STOP BY THE FIRST FLOOR:  get the XR   Continue avoiding tobacco

## 2019-06-05 NOTE — Progress Notes (Signed)
Subjective:    Patient ID: Denise Benitez, female    DOB: [birth year], 53 y.o.   MRN: [id number]  DOS:  06/05/2019 Type of visit - description: new pt The patient is here to get established.  Has multiple concerns.  Went to the ER on 01/09/2019: Presented with cough, chest pain, shortness of breath. Chest x-ray left upper lobe and left lower lobe opacity, influenza test negative , CMP okay except for a sodium of 134.  CBC normal, urine normal, pregnancy test negative, troponin negative, blood cultures negative  After the ER visit, she felt better, however few weeks later, developed anterior bilateral chest pain, went to urgent care, was prescribed NSAIDs, felt better.  No x-rays done.  She is here today because in the last 4 to 5 weeks she is not feeling well: Feeling fatigue, + DOE, she sometimes gets short of breath at rest. Also has noted palpitation mostly with physical activity.  Additionally, 1 year history of gradual weight loss.  Review of Systems Denies fever chills No chest pain No lower extremity edema or orthopnea No nausea, vomiting.  No abdominal pain or blood in the stools. Has occasionally diarrhea, for years on and off. No GERD symptoms, no dysphagia or odynophagia Coughs frequently, a chronic issue.  Cough is dry, never had hemoptysis. I ask about her mood and admits to feeling somewhat anxious and even depressed due to the quarantine. No dysuria or gross hematuria   Past Medical History:  Diagnosis Date  . ADD (attention deficit disorder)   . Anxiety   . Asthma   . Bartholin gland cyst   . Colon polyp    adenomatous  . Diverticulosis   . Elevated LFTs   . GERD (gastroesophageal reflux disease)   . Hemorrhoids   . Hyperlipidemia   . Hypothyroidism   . Perimenopause   . Varicose veins     Past Surgical History:  Procedure Laterality Date  . CHOLECYSTECTOMY  1995  . OVARIAN CYST REMOVAL Right   . PELVIC LAPAROSCOPY     ovarian cystectomy  . [place]    Social History   Socioeconomic History  . Marital status: Married    Spouse name: Not on file  . Number of children: 2  . Years of education: Not on file  . Highest education level: Not on file  Occupational History  . Occupation: house wife  Social Needs  . Financial resource strain: Not on file  . Food insecurity    Worry: Not on file    Inability: Not on file  . Transportation needs    Medical: Not on file    Non-medical: Not on file  Tobacco Use  . Smoking status: Former Smoker    Packs/day: 0.50    Types: Cigarettes    Quit date: 05/2019    Years since quitting: 0.0  . Smokeless tobacco: Never Used  . Tobacco comment: 1/2 ppd age 39 to 21 (20 years) quit 05/2019  Substance and Sexual Activity  . Alcohol use: Yes    Alcohol/week: 0.0 standard drinks    Comment: weekends 10  . Drug use: No  . Sexual activity: Yes    Partners: Male    Comment: 1st intercourse- 2, partners- 15, married- 24 yrs   Lifestyle  . Physical activity    Days per week: Not on file    Minutes per session: Not on file  . Stress: Not on file  Relationships  . Social connections  Talks on phone: Not on file    Gets together: Not on file    Attends religious service: Not on file    Active member of club or organization: Not on file    Attends meetings of clubs or organizations: Not on file    Relationship status: Not on file  . Intimate partner violence    Fear of current or ex partner: Not on file    Emotionally abused: Not on file    Physically abused: Not on file    Forced sexual activity: Not on file  Other Topics Concern  . Not on file  Social History Narrative   Original from [place], moved to [place]: pt, husband, younger son     Family History  Problem Relation Age of Onset  . Hypertension Father   . Liver disease Father   . Hypertension Paternal Aunt   . Stomach cancer Paternal Uncle        mets  . Hypertension Paternal Uncle   .  Breast cancer Paternal Grandmother   . Hypertension Paternal Grandmother   . Colon polyps Paternal Aunt   . Colon cancer Neg Hx     Allergies as of 06/05/2019      Reactions   Codeine Itching      Medication List       Accurate as of June 05, 2019 11:59 PM. If you have any questions, ask your nurse or doctor.        STOP taking these medications   amoxicillin 500 MG capsule Commonly known as: AMOXIL Stopped by: Kathlene November, MD   azithromycin 250 MG tablet Commonly known as: ZITHROMAX Stopped by: Kathlene November, MD   DayQuil Severe + VapoCool 5-10-200-325 MG/15ML Liqd Generic drug: Phenylephrine-DM-GG-APAP Stopped by: Kathlene November, MD   ibuprofen 200 MG tablet Commonly known as: ADVIL Stopped by: Kathlene November, MD   NyQuil HBP Cold & Flu 15-6.25-325 MG/15ML Liqd Generic drug: DM-Doxylamine-Acetaminophen Stopped by: Kathlene November, MD   omeprazole 20 MG capsule Commonly known as: PRILOSEC Stopped by: Kathlene November, MD   sodium chloride 0.65 % Soln nasal spray Commonly known as: OCEAN Stopped by: Kathlene November, MD     TAKE these medications   amphetamine-dextroamphetamine 10 MG tablet Commonly known as: Adderall Take 1 tablet (10 mg total) by mouth daily with breakfast.   aspirin 81 MG tablet Take 81 mg by mouth daily.   Azelaic Acid 15 % cream After skin is thoroughly washed and patted dry, gently but thoroughly massage a thin film of azelaic acid cream into the affected area twice daily, in the morning and evening.   Calcium-Vitamin D 600-200 MG-UNIT tablet Take 1 tablet by mouth daily.   cholecalciferol 25 MCG (1000 UT) tablet Commonly known as: VITAMIN D3 Take 1,000 Units by mouth daily.   escitalopram 10 MG tablet Commonly known as: LEXAPRO Take 1 tablet (10 mg total) by mouth daily. Started by: Kathlene November, MD   Fish Oil 1000 MG Caps Take 1,000 mg by mouth daily.   HAIR/SKIN/NAILS PO Take 1 tablet by mouth daily.   vitamin C 1000 MG tablet Take 500 mg by mouth 2 (two) times  daily.   vitamin E 400 UNIT capsule Take 400 Units by mouth daily.           Objective:   Physical Exam BP 129/86 (BP Location: Right Arm, Patient Position: Sitting, Cuff Size: Small)   Pulse 99   Temp 98.3 F (36.8 C) (  Oral)   Resp 16   Ht 5\' 5"  (1.651 m)   Wt 143 lb (64.9 kg)   LMP 12/01/2016   SpO2 99%   BMI 23.80 kg/m    General: Well developed, NAD, BMI noted Neck: No  thyromegaly, no mass or lymphadenopathies.  No supraclavicular mass. Normal carotid pulses HEENT:  Normocephalic . Face symmetric, atraumatic.  Not pale Lungs:  Decreased breath sounds Normal respiratory effort, no intercostal retractions, no accessory muscle use. Heart: RRR,  no murmur.  No pretibial edema bilaterally Good pedal pulses Abdomen:  Not distended, soft, non-tender. No rebound or rigidity.   Skin: Exposed areas without rash. Not pale. Not jaundice Neurologic:  alert & oriented X3.  Speech normal, gait appropriate for age and unassisted Strength symmetric and appropriate for age.  Psych: Cognition and judgment appear intact.  Cooperative with normal attention span and concentration.  Behavior appropriate. No anxious or depressed appearing.     Assessment    Assessment (new patient 06-2019, referred by Mr. Blaine Hamper) History of Raynaud phenomena, on aspirin Hyperlipidemia Smoker Thyroid disease History of ADD  PLAN: New patient, 53 year old lady, presents with:  DOE, recent pneumonia, fatigue, palpitations, smoker: Multiple symptoms, DDX is large including COPD, occult CAD, thyroid disease, depression, malignancy, pleural effusions, multifactorial, etc. EKG today: Sinus tachycardia We will get a CMP, CBC, TSH, T3-T4, chest x-ray. Reassess in 2 weeks Weight loss?  Weight today is 143 pounds, last year at gynecology wt was 147 pounds. Depression anxiety insomnia : PHQ-9: 11; meds? We agreed on lexapro, 10 mg , s/e d/w pt including s/i Smoker: Half pack a day for 40  years, has quit on and off, currently not smoking for the last few weeks  Addendum, results reviewed: Mild hyponatremia, alkaline phosphate slightly elevated, TFTs normal, chest x-ray showed new right pleural effusion.  Left pneumonia resolved Patient is aware, need further evaluation, refer  to pulmonary. RTC initially recommended in 2 weeks but will do it in 2 months.  Patient will call and arrange.

## 2019-06-05 NOTE — Progress Notes (Signed)
Pre visit review using our clinic review tool, if applicable. No additional management support is needed unless otherwise documented below in the visit note. 

## 2019-06-10 ENCOUNTER — Telehealth: Payer: Self-pay

## 2019-06-10 NOTE — Telephone Encounter (Signed)
Copied from [place] [id number]. Topic: Referral - Status >> Jun 10, 2019  9:53 AM Burchel, Abbi R wrote: Reason for CRM: Pt called to check the status of her referral to lung specialist.  Please have Kennyth Lose to [place] call pt to advise/update

## 2019-06-10 NOTE — Telephone Encounter (Signed)
Spoke with pulmonary, will see Dr. Melvyn Novas on Thursday, 06/13/2019 at 2 PM.  They will get that translator for her. Called patient, she is aware of the appointment, address, etc.

## 2019-06-11 ENCOUNTER — Telehealth: Payer: Self-pay | Admitting: *Deleted

## 2019-06-11 NOTE — Telephone Encounter (Signed)
-----   Message from Tanda Rockers, MD sent at 06/11/2019  6:10 AM EDT ----- Regarding: referral Sure - we can ger her in this week -thanks! ----- Message ----- From: Colon Branch, MD Sent: 06/10/2019  12:47 PM EDT To: Colon Branch, MD, Tanda Rockers, MD Subject: referral                                       Ronalee Belts, this lady has a pleural effusion, probably malignant, could you see her this week?Marland Kitchen

## 2019-06-11 NOTE — Telephone Encounter (Signed)
Already scheduled for thurs at 2:00 Pt aware

## 2019-06-13 ENCOUNTER — Encounter: Payer: Self-pay | Admitting: Internal Medicine

## 2019-06-13 ENCOUNTER — Ambulatory Visit: Payer: BC Managed Care – PPO | Admitting: Internal Medicine

## 2019-06-13 ENCOUNTER — Other Ambulatory Visit: Payer: Self-pay

## 2019-06-13 DIAGNOSIS — J90 Pleural effusion, not elsewhere classified: Secondary | ICD-10-CM

## 2019-06-13 NOTE — Assessment & Plan Note (Signed)
Onset of symptoms 05/2019 -  U/s guided t centesis ordered    The recent pna suggestive of parapneumonic process though no obvious dz on the R even in retrospect so concerned about other etiologies with smoking hx worrisome for occult lung ca (though don't see that either on prior cxr from 01/09/19 and most common cause of malignant effusion is from lung primary.   Discussed in detail all the  indications, usual  risks and alternatives  relative to the benefits with patient who agrees to proceed with w/u as outlined with thoracentesis asap 1st    Total time devoted to counseling  > 50 % of initial 60 min office visit:  review case with pt/ discussion of options/alternatives/ personally creating written customized instructions  in presence of pt  then going over those specific  Instructions directly with the pt including how to use all of the meds but in particular covering each new medication in detail and the difference between the maintenance= "automatic" meds and the prns using an action plan format for the latter (If this problem/symptom => do that organization reading Left to right).  Please see AVS from this visit for a full list of these instructions which I personally wrote for this pt and  are unique to this visit.

## 2019-06-13 NOTE — Patient Instructions (Signed)
We will call you to schedule a R thoracentesis to find out what's causing the fluid in your Right chest and I will with the results to decide what to do next

## 2019-06-13 NOTE — Progress Notes (Signed)
Denise Benitez, female    DOB: Mar 25, 1966,   MRN: [id number]   Brief patient profile:  53 yo female from [place] dx with asthma as child on shots x ? sev years  only and  quit smoking 05/2019 p  ER eval   [facility] 01/09/19  with chest cold / fever  Dx cap LUL/LLL and better p rx and resumed smoking as 90% better then more doe  in July 2020 so stopped smoking and eval by 06/05/2019 with new R effusion with no L as dz so referred to pulmonary clinic 06/13/2019 by Dr   Denise Benitez.   Tends to cough and sneeze every spring x years on zyrtec only      History of Present Illness  06/13/2019  Pulmonary/ 1st office eval/Denise Benitez  Chief Complaint  Patient presents with  . Pulmonary Consult    Referred by Dr. Kathlene Benitez for eval of pleural effusion.   Dyspnea:  MMRC3 = can't walk 100 yards even at a slow pace at a flat grade s stopping due to sob   Cough: none now / no h/o purulent sputum Sleep: at 60 degrees now and still a bit uncomfortable with breathing   SABA use: no inhalers   No obvious day to day or daytime variability or assoc excess/ purulent sputum or mucus plugs or hemoptysis or cp or chest tightness, subjective wheeze or overt sinus or hb symptoms.     Also denies any obvious fluctuation of symptoms with weather or environmental changes or other aggravating or alleviating factors except as outlined above   No unusual exposure hx or h/o childhood pna/ asthma or knowledge of premature birth.  Current Allergies, Complete Past Medical History, Past Surgical History, Family History, and Social History were reviewed in [employer] record.  ROS  The following are not active complaints unless bolded Hoarseness, sore throat, dysphagia, dental problems, itching, sneezing,  nasal congestion or discharge of excess mucus or purulent secretions, ear ache,   fever, chills, sweats, unintended wt loss or wt gain, classically pleuritic or exertional cp,  orthopnea pnd or arm/hand swelling  or leg  swelling, presyncope, palpitations, abdominal pain, anorexia, nausea, vomiting, diarrhea  or change in bowel habits or change in bladder habits, change in stools or change in urine, dysuria, hematuria,  rash, arthralgias, visual complaints, headache, numbness, weakness or ataxia or problems with walking or coordination,  change in mood or  memory.           Past Medical History:  Diagnosis Date  . ADD (attention deficit disorder)   . Anxiety   . Asthma   . Bartholin gland cyst   . Colon polyp    adenomatous  . Diverticulosis   . Elevated LFTs   . GERD (gastroesophageal reflux disease)   . Hemorrhoids   . Hyperlipidemia   . Hypothyroidism   . Perimenopause   . Varicose veins     Outpatient Medications Prior to Visit  Medication Sig Dispense Refill  . amphetamine-dextroamphetamine (ADDERALL) 10 MG tablet Take 1 tablet (10 mg total) by mouth daily with breakfast. 30 tablet 0  . Ascorbic Acid (VITAMIN C) 1000 MG tablet Take 500 mg by mouth 2 (two) times daily.     Marland Kitchen aspirin 81 MG tablet Take 81 mg by mouth daily.    . Azelaic Acid 15 % cream After skin is thoroughly washed and patted dry, gently but thoroughly massage a thin film of azelaic acid cream into the affected area  twice daily, in the morning and evening. 30 g 0  . Calcium-Vitamin D 600-200 MG-UNIT per tablet Take 1 tablet by mouth daily.    . cholecalciferol (VITAMIN D3) 25 MCG (1000 UT) tablet Take 1,000 Units by mouth daily.    Marland Kitchen escitalopram (LEXAPRO) 10 MG tablet Take 1 tablet (10 mg total) by mouth daily. 30 tablet 1  . ibuprofen (ADVIL) 200 MG tablet Take 200 mg by mouth every 6 (six) hours as needed for headache.    . Multiple Vitamins-Minerals (HAIR/SKIN/NAILS PO) Take 1 tablet by mouth daily.    . Omega-3 Fatty Acids (FISH OIL) 1000 MG CAPS Take 1,000 mg by mouth daily.     . vitamin E 400 UNIT capsule Take 400 Units by mouth daily.           Objective:     BP 110/72 (BP Location: Left Arm, Cuff Size: Normal)    Pulse 92   Temp 98.2 F (36.8 C) (Oral)   Ht 5\' 5"  (1.651 m)   Wt 145 lb (65.8 kg)   LMP 12/01/2016   SpO2 98% Comment: on RA  BMI 24.13 kg/m   SpO2: 98 %(on RA)   HEENT: nl dentition, turbinates bilaterally, and oropharynx. Nl external ear canals without cough reflex   NECK :  without JVD/Nodes/TM/ nl carotid upstrokes bilaterally   LUNGS: no acc muscle use,  Nl contour chest clear on L and decreased bs/ dullness 1/3 R chest without cough on insp or exp maneuvers   CV:  RRR  no s3 or murmur or increase in P2, and no edema   ABD:  soft and nontender with nl inspiratory excursion in the supine position. No bruits or organomegaly appreciated, bowel sounds nl  MS:  Nl gait/ ext warm without deformities, calf tenderness, cyanosis or clubbing No obvious joint restrictions   SKIN: warm and dry without lesions    NEURO:  alert, approp, nl sensorium with  no motor or cerebellar deficits apparent.     I personally reviewed images and agree with radiology impression as follows:  CXR:   06/05/2019 1. New moderate-sized right pleural effusion of indeterminate etiology. Associated right basilar pulmonary opacity which could reflect atelectasis or pneumonia. Consider thoracentesis. Radiographic follow up recommended. 2. Interval resolution of patchy airspace disease at the left lung base. 3. Healing rib fractures bilaterally.   Labs 06/05/19  Alb 4.1,  TSH  3.4      Assessment   Pleural effusion on right Onset of symptoms 05/2019 -  U/s guided t centesis ordered    The recent pna suggestive of parapneumonic process though no obvious dz on the R even in retrospect so concerned about other etiologies with smoking hx worrisome for occult lung ca (though don't see that either on prior cxr from 01/09/19 and most common cause of malignant effusion is from lung primary.   Discussed in detail all the  indications, usual  risks and alternatives  relative to the benefits with patient  who agrees to proceed with w/u as outlined with thoracentesis asap 1st    Total time devoted to counseling  > 50 % of initial 60 min office visit:  review case with pt/ discussion of options/alternatives/ personally creating written customized instructions  in presence of pt  then going over those specific  Instructions directly with the pt including how to use all of the meds but in particular covering each new medication in detail and the difference between the maintenance= "automatic" meds and  the prns using an action plan format for the latter (If this problem/symptom => do that organization reading Left to right).  Please see AVS from this visit for a full list of these instructions which I personally wrote for this pt and  are unique to this visit.      Christinia Gully, MD 06/13/2019

## 2019-06-14 ENCOUNTER — Ambulatory Visit (HOSPITAL_COMMUNITY): Payer: BC Managed Care – PPO

## 2019-06-17 ENCOUNTER — Other Ambulatory Visit (HOSPITAL_COMMUNITY)
Admission: RE | Admit: 2019-06-17 | Discharge: 2019-06-17 | Disposition: A | Discharge status: Home / Self Care | Payer: BC Managed Care – PPO | Source: Ambulatory Visit | Attending: Internal Medicine | Admitting: Internal Medicine

## 2019-06-17 DIAGNOSIS — Z20828 Contact with and (suspected) exposure to other viral communicable diseases: Secondary | ICD-10-CM | POA: Insufficient documentation

## 2019-06-17 DIAGNOSIS — Z01812 Encounter for preprocedural laboratory examination: Secondary | ICD-10-CM | POA: Diagnosis not present

## 2019-06-17 LAB — SARS CORONAVIRUS 2 (TAT 6-24 HRS): SARS Coronavirus 2: NEGATIVE

## 2019-06-19 ENCOUNTER — Other Ambulatory Visit: Payer: Self-pay

## 2019-06-19 ENCOUNTER — Ambulatory Visit (HOSPITAL_COMMUNITY)
Admission: RE | Admit: 2019-06-19 | Discharge: 2019-06-19 | Disposition: A | Discharge status: Home / Self Care | Payer: BC Managed Care – PPO | Source: Ambulatory Visit | Attending: Internal Medicine | Admitting: Internal Medicine

## 2019-06-19 ENCOUNTER — Ambulatory Visit (HOSPITAL_COMMUNITY)
Admission: RE | Admit: 2019-06-19 | Discharge: 2019-06-19 | Disposition: A | Discharge status: Home / Self Care | Payer: BC Managed Care – PPO | Source: Ambulatory Visit | Attending: Radiology | Admitting: Radiology

## 2019-06-19 DIAGNOSIS — J90 Pleural effusion, not elsewhere classified: Secondary | ICD-10-CM

## 2019-06-19 DIAGNOSIS — R846 Abnormal cytological findings in specimens from respiratory organs and thorax: Secondary | ICD-10-CM | POA: Diagnosis not present

## 2019-06-19 DIAGNOSIS — Z9889 Other specified postprocedural states: Secondary | ICD-10-CM

## 2019-06-19 LAB — ALBUMIN, PLEURAL OR PERITONEAL FLUID: Albumin, Fluid: 2.2 g/dL

## 2019-06-19 LAB — BODY FLUID CELL COUNT WITH DIFFERENTIAL
Eos, Fluid: 11 %
Lymphs, Fluid: 18 %
Monocyte-Macrophage-Serous Fluid: 68 % (ref 50–90)
Neutrophil Count, Fluid: 3 % (ref 0–25)
Total Nucleated Cell Count, Fluid: 764 cu mm (ref 0–1000)

## 2019-06-19 LAB — PROTEIN, PLEURAL OR PERITONEAL FLUID: Total protein, fluid: 3.6 g/dL

## 2019-06-19 LAB — GLUCOSE, PLEURAL OR PERITONEAL FLUID: Glucose, Fluid: 112 mg/dL

## 2019-06-19 LAB — LACTATE DEHYDROGENASE, PLEURAL OR PERITONEAL FLUID: LD, Fluid: 144 U/L — ABNORMAL HIGH (ref 3–23)

## 2019-06-19 MED ORDER — LIDOCAINE HCL 1 % IJ SOLN
INTRAMUSCULAR | Status: AC
  Filled 2019-06-19: qty 10

## 2019-06-19 NOTE — Procedures (Signed)
Ultrasound-guided diagnostic and therapeutic right thoracentesis performed yielding 1.5 liters of hazy,amber fluid. No immediate complications. Follow-up chest x-ray pending. A portion of the fluid was sent to the lab for preordered studies. EBL<1 cc.

## 2019-06-20 LAB — TRIGLYCERIDES, BODY FLUIDS: Triglycerides, Fluid: 17 mg/dL

## 2019-06-21 ENCOUNTER — Ambulatory Visit: Payer: BC Managed Care – PPO | Admitting: Internal Medicine

## 2019-06-21 ENCOUNTER — Ambulatory Visit: Payer: BC Managed Care – PPO

## 2019-06-21 ENCOUNTER — Other Ambulatory Visit: Payer: Self-pay

## 2019-06-21 DIAGNOSIS — J90 Pleural effusion, not elsewhere classified: Secondary | ICD-10-CM

## 2019-07-19 ENCOUNTER — Encounter: Payer: Self-pay | Admitting: Internal Medicine

## 2019-07-19 ENCOUNTER — Ambulatory Visit (INDEPENDENT_AMBULATORY_CARE_PROVIDER_SITE_OTHER): Payer: BC Managed Care – PPO

## 2019-07-19 ENCOUNTER — Other Ambulatory Visit: Payer: Self-pay

## 2019-07-19 ENCOUNTER — Ambulatory Visit (INDEPENDENT_AMBULATORY_CARE_PROVIDER_SITE_OTHER): Payer: BC Managed Care – PPO | Admitting: Internal Medicine

## 2019-07-19 DIAGNOSIS — J90 Pleural effusion, not elsewhere classified: Secondary | ICD-10-CM | POA: Diagnosis not present

## 2019-07-19 LAB — CBC WITH DIFFERENTIAL/PLATELET
Basophils Absolute: 0.1 10*3/uL (ref 0.0–0.1)
Basophils Relative: 1.2 % (ref 0.0–3.0)
Eosinophils Absolute: 0.6 10*3/uL (ref 0.0–0.7)
Eosinophils Relative: 7.1 % — ABNORMAL HIGH (ref 0.0–5.0)
HCT: 40.3 % (ref 36.0–46.0)
Hemoglobin: 13.3 g/dL (ref 12.0–15.0)
Lymphocytes Relative: 15.2 % (ref 12.0–46.0)
Lymphs Abs: 1.3 10*3/uL (ref 0.7–4.0)
MCHC: 32.9 g/dL (ref 30.0–36.0)
MCV: 86.1 fl (ref 78.0–100.0)
Monocytes Absolute: 0.8 10*3/uL (ref 0.1–1.0)
Monocytes Relative: 9.6 % (ref 3.0–12.0)
Neutro Abs: 5.7 10*3/uL (ref 1.4–7.7)
Neutrophils Relative %: 66.9 % (ref 43.0–77.0)
Platelets: 311 10*3/uL (ref 150.0–400.0)
RBC: 4.68 Mil/uL (ref 3.87–5.11)
RDW: 14.5 % (ref 11.5–15.5)
WBC: 8.5 10*3/uL (ref 4.0–10.5)

## 2019-07-19 LAB — SEDIMENTATION RATE: Sed Rate: 41 mm/hr — ABNORMAL HIGH (ref 0–30)

## 2019-07-19 NOTE — Progress Notes (Signed)
Denise Benitez, female    DOB: [birth year],   MRN: [id number]   Brief patient profile:  53 yo female from [place] dx with asthma as child on shots x ? sev years  only and  quit smoking 05/2019 p  ER eval   [facility] 01/09/19  with chest cold / fever  Dx cap LUL/LLL and better p rx and resumed smoking as 90% better then more doe  in July 2020 so stopped smoking and eval by 06/05/2019 with new R effusion with no L as dz so referred to pulmonary clinic 06/13/2019 by Dr   Larose Kells.   Tends to cough and sneeze every spring x years on zyrtec only      History of Present Illness  06/13/2019  Pulmonary/ 1st office eval/Dylanie Quesenberry  Chief Complaint  Patient presents with  . Pulmonary Consult    Referred by Dr. Kathlene November for eval of pleural effusion.   Dyspnea:  MMRC3 = can't walk 100 yards even at a slow pace at a flat grade s stopping due to sob   Cough: none now / no h/o purulent sputum Sleep: at 60 degrees now and still a bit uncomfortable with breathing   SABA use: no inhalers  rec R Tcentesis > -  U/s guided t centesis 06/19/2019  =  1.5 liters,  Borderline exudate wbc 764 with 11% eos,  L >PMNs and cyt neg   07/19/2019  f/u ov/Karimah Winquist re: R effusion exudative/ lymphocytic /increased eos Chief Complaint  Patient presents with  . Follow-up    Breathing has been better but she is c/o feeling tired.   Dyspnea:  Not limited by breathing from desired activities  But by fatigue Cough: none Sleeping: flat now  SABA use: none 02: none    No obvious day to day or daytime variability or assoc excess/ purulent sputum or mucus plugs or hemoptysis or cp or chest tightness, subjective wheeze or overt sinus or hb symptoms.   Sleeping as above  without nocturnal  or early am exacerbation  of respiratory  c/o's or need for noct saba. Also denies any obvious fluctuation of symptoms with weather or environmental changes or other aggravating or alleviating factors except as outlined above   No unusual exposure hx or h/o  childhood pna/ asthma or knowledge of premature birth.  Current Allergies, Complete Past Medical History, Past Surgical History, Family History, and Social History were reviewed in [employer] record.  ROS  The following are not active complaints unless bolded Hoarseness, sore throat, dysphagia, dental problems, itching, sneezing,  nasal congestion or discharge of excess mucus or purulent secretions, ear ache,   fever, chills, sweats, unintended wt loss or wt gain, classically pleuritic or exertional cp,  orthopnea pnd or arm/hand swelling  or leg swelling, presyncope, palpitations, abdominal pain, anorexia, nausea, vomiting, diarrhea  or change in bowel habits or change in bladder habits, change in stools or change in urine, dysuria, hematuria,  rash, arthralgias, visual complaints, headache, numbness, weakness or ataxia or problems with walking or coordination,  change in mood or  memory.        Current Meds  Medication Sig  . amphetamine-dextroamphetamine (ADDERALL) 10 MG tablet Take 1 tablet (10 mg total) by mouth daily with breakfast.  . Ascorbic Acid (VITAMIN C) 1000 MG tablet Take 500 mg by mouth 2 (two) times daily.   Marland Kitchen aspirin 81 MG tablet Take 81 mg by mouth daily.  . Azelaic Acid 15 % cream After  skin is thoroughly washed and patted dry, gently but thoroughly massage a thin film of azelaic acid cream into the affected area twice daily, in the morning and evening.  . Calcium-Vitamin D 600-200 MG-UNIT per tablet Take 1 tablet by mouth daily.  . cholecalciferol (VITAMIN D3) 25 MCG (1000 UT) tablet Take 1,000 Units by mouth daily.  Marland Kitchen escitalopram (LEXAPRO) 10 MG tablet Take 1 tablet (10 mg total) by mouth daily.  Marland Kitchen ibuprofen (ADVIL) 200 MG tablet Take 200 mg by mouth every 6 (six) hours as needed for headache.  . Multiple Vitamins-Minerals (HAIR/SKIN/NAILS PO) Take 1 tablet by mouth daily.  . Omega-3 Fatty Acids (FISH OIL) 1000 MG CAPS Take 1,000 mg by mouth daily.    . vitamin E 400 UNIT capsule Take 400 Units by mouth daily.              Past Medical History:  Diagnosis Date  . ADD (attention deficit disorder)   . Anxiety   . Asthma   . Bartholin gland cyst   . Colon polyp    adenomatous  . Diverticulosis   . Elevated LFTs   . GERD (gastroesophageal reflux disease)   . Hemorrhoids   . Hyperlipidemia   . Hypothyroidism   . Perimenopause   . Varicose veins        Objective:     Wt Readings from Last 3 Encounters:  07/19/19 142 lb (64.4 kg)  06/13/19 145 lb (65.8 kg)  06/05/19 143 lb (64.9 kg)     Vital signs reviewed - Note on arrival 02 sats  98% on RA       amb wf nad    HEENT : pt wearing mask not removed for exam due to covid -19 concerns.    NECK :  without JVD/Nodes/TM/ nl carotid upstrokes bilaterally   LUNGS: no acc muscle use,  Nl contour chest with decreased bs/dullness R base 1/4 up   without cough on insp or exp maneuvers   CV:  RRR  no s3 or murmur or increase in P2, and no edema   ABD:  soft and nontender with nl inspiratory excursion in the supine position. No bruits or organomegaly appreciated, bowel sounds nl  MS:  Nl gait/ ext warm without deformities, calf tenderness, cyanosis or clubbing No obvious joint restrictions   SKIN: warm and dry without lesions    NEURO:  alert, approp, nl sensorium with  no motor or cerebellar deficits apparent.      CXR PA and Lateral:   07/19/2019 :    I personally reviewed images and agree with radiology impression as follows:    Small to moderate right pleural effusion, increased in size since the prior exam.     Labs ordered 07/19/2019  :   Quant TB     Lab Results  Component Value Date   ESRSEDRATE 41 (H) 07/19/2019     Lab Results  Component Value Date   WBC 8.5 07/19/2019   HGB 13.3 07/19/2019   HCT 40.3 07/19/2019   MCV 86.1 07/19/2019   PLT 311.0 07/19/2019       EOS                                                               0.6  07/19/2019     Assessment

## 2019-07-19 NOTE — Patient Instructions (Addendum)
Please remember to go to the lab department   for your tests - we will call you with the results when they are available.      Please schedule a follow up office visit in 4 weeks, call sooner if needed.

## 2019-07-20 ENCOUNTER — Encounter: Payer: Self-pay | Admitting: Internal Medicine

## 2019-07-20 NOTE — Assessment & Plan Note (Addendum)
Onset of symptoms 05/2019 -  U/s guided t centesis 06/19/2019  =  1.5 liters,  Borderline exudate wbc 764 with 11% eos,  L >PMNs and cyt neg - Quant TB GOLD 07/19/2019     Findings so far are non-specific (including the increased eos which favor a benign cause but can be seen with ca)  and over the last 4 weeks the fluid has started to re-accumulate but presently asymptomatic so rec tap dry when symptoms develop with repeat cbc with diff and cyt and glucose and CT chest w/in 24 h of the study to look at the underlying lung better than we can do with cxr post procedure or ct prior to it.   Discussed in detail all the  indications, usual  risks and alternatives  relative to the benefits with patient who agrees to proceed with w/u as outlined.     I had an extended discussion with the patient reviewing all relevant studies completed to date and  lasting 15 to 20 minutes of a 25 minute visit    Each maintenance medication was reviewed in detail including most importantly the difference between maintenance and prns and under what circumstances the prns are to be triggered using an action plan format that is not reflected in the computer generated alphabetically organized AVS.     Please see AVS for specific instructions unique to this visit that I personally wrote and verbalized to the the pt in detail and then reviewed with pt  by my nurse highlighting any  changes in therapy recommended at today's visit to their plan of care.

## 2019-07-24 ENCOUNTER — Encounter: Payer: Self-pay | Admitting: Gynecology

## 2019-07-25 ENCOUNTER — Encounter: Payer: Self-pay | Admitting: Internal Medicine

## 2019-07-25 DIAGNOSIS — E8801 Alpha-1-antitrypsin deficiency: Secondary | ICD-10-CM | POA: Insufficient documentation

## 2019-07-25 LAB — QUANTIFERON-TB GOLD PLUS
Mitogen-NIL: >10 IU/mL
NIL: 0.02 IU/mL
QuantiFERON-TB Gold Plus: NEGATIVE
TB1-NIL: 0.03 IU/mL
TB2-NIL: 0.01 IU/mL

## 2019-07-25 LAB — ALPHA-1 ANTITRYPSIN PHENOTYPE: A-1 Antitrypsin, Ser: 102 mg/dL (ref 83–199)

## 2019-07-25 NOTE — Progress Notes (Signed)
Spoke with pt and notified of results per Dr. Wert. Pt verbalized understanding and denied any questions. 

## 2019-08-13 ENCOUNTER — Other Ambulatory Visit: Payer: Self-pay | Admitting: Internal Medicine

## 2019-08-19 ENCOUNTER — Ambulatory Visit: Payer: BC Managed Care – PPO | Admitting: Internal Medicine

## 2019-09-02 ENCOUNTER — Other Ambulatory Visit: Payer: Self-pay

## 2019-09-02 ENCOUNTER — Ambulatory Visit: Payer: BC Managed Care – PPO | Admitting: Internal Medicine

## 2019-09-02 ENCOUNTER — Encounter: Payer: Self-pay | Admitting: Internal Medicine

## 2019-09-02 ENCOUNTER — Ambulatory Visit (INDEPENDENT_AMBULATORY_CARE_PROVIDER_SITE_OTHER): Payer: BC Managed Care – PPO

## 2019-09-02 VITALS — BP 98/60 | HR 96 | Temp 97.6°F | Ht 60.0 in | Wt 147.0 lb

## 2019-09-02 DIAGNOSIS — J90 Pleural effusion, not elsewhere classified: Secondary | ICD-10-CM

## 2019-09-02 MED ORDER — PREDNISONE 10 MG PO TABS: ORAL_TABLET | ORAL | 0 refills | Status: DC

## 2019-09-02 NOTE — Patient Instructions (Addendum)
We will set you up with a consultation with Dr Roxan Hockey for VATS (pleuroscopy) for Right pleural effusion in about two weeks   predisone is 10 mg x 2 with breakast  Until you see Dr Roxan Hockey

## 2019-09-02 NOTE — Assessment & Plan Note (Addendum)
Onset of symptoms 05/2019 -  U/s guided t centesis 06/19/2019  =  1.5 liters,  Borderline exudate wbc 764 with 11% eos,  L >PMNs and cyt neg - Quant TB GOLD 07/19/2019  Neg  -  Esr 41 07/19/19 and R effusion no change 09/02/2019 > start pred 20 mg daily and referred to CT surgery ? Needs vats dx/ therapeutic      If convincing decrease by the time of consult could postpone for now though she appears to be a good surgical candidate and we really don't have a specific dx.  Discussed in detail all the  indications, usual  risks and alternatives  relative to the benefits with patient who agrees to proceed with w/u as outlined.   > 50 % of 25 min ov spent on counseling with pt/ husband

## 2019-09-02 NOTE — Progress Notes (Signed)
Denise Benitez, female    DOB: 1966-02-27,   MRN: [id number]   Brief patient profile:  53 yo female from [place] dx with asthma as child on shots x ? sev years  only and  quit smoking 05/2019 p  ER eval   [facility] 01/09/19  with chest cold / fever  Dx cap LUL/LLL and better p rx and resumed smoking as 90% better then more doe  in July 2020 so stopped smoking and eval by 06/05/2019 with new R effusion with no L as dz so referred to pulmonary clinic 06/13/2019 by Dr   Denise Benitez.   Tends to cough and sneeze every spring x years on zyrtec only      History of Present Illness  06/13/2019  Pulmonary/ 1st office eval/Denise Benitez  Chief Complaint  Patient presents with  . Pulmonary Consult    Referred by Dr. Kathlene Benitez for eval of pleural effusion.   Dyspnea:  MMRC3 = can't walk 100 yards even at a slow pace at a flat grade s stopping due to sob   Cough: none now / no h/o purulent sputum Sleep: at 60 degrees now and still a bit uncomfortable with breathing   SABA use: no inhalers  rec R Tcentesis > -  U/s guided t centesis 06/19/2019  =  1.5 liters,  Borderline exudate wbc 764 with 11% eos,  L >PMNs and cyt neg   07/19/2019  f/u ov/Denise Benitez re: R effusion exudative/ lymphocytic /increased eos Chief Complaint  Patient presents with  . Follow-up    Breathing has been better but she is c/o feeling tired.   Dyspnea:  Not limited by breathing from desired activities  But by fatigue Cough: none Sleeping: flat now  rec No change rx      09/02/2019  f/u ov/Denise Benitez re: R effusions lymphocytic with increaesed eos/ esr 7  Chief Complaint  Patient presents with  . Follow-up    CXR repeated today. Breathing has improved.   Dyspnea:  Not limited by breathing from desired activities  Cough: no  Sleeping: flat fine  SABA use: none 02: none   No obvious day to day or daytime variability or assoc excess/ purulent sputum or mucus plugs or hemoptysis or cp or chest tightness, subjective wheeze or overt sinus or hb  symptoms.   Sleeping as above without nocturnal  or early am exacerbation  of respiratory  c/o's or need for noct saba. Also denies any obvious fluctuation of symptoms with weather or environmental changes or other aggravating or alleviating factors except as outlined above   No unusual exposure hx or h/o childhood pna/ asthma or knowledge of premature birth.  Current Allergies, Complete Past Medical History, Past Surgical History, Family History, and Social History were reviewed in [employer] record.  ROS  The following are not active complaints unless bolded Hoarseness, sore throat, dysphagia, dental problems, itching, sneezing,  nasal congestion or discharge of excess mucus or purulent secretions, ear ache,   fever, chills, sweats, unintended wt loss or wt gain, classically pleuritic or exertional cp,  orthopnea pnd or arm/hand swelling  or leg swelling, presyncope, palpitations, abdominal pain, anorexia, nausea, vomiting, diarrhea  or change in bowel habits or change in bladder habits, change in stools or change in urine, dysuria, hematuria,  rash, arthralgias, visual complaints, headache, numbness, weakness or ataxia or problems with walking or coordination,  change in mood or  memory.        Current Meds  Medication  Sig  . Ascorbic Acid (VITAMIN C) 1000 MG tablet Take 500 mg by mouth 2 (two) times daily.   Marland Kitchen aspirin 81 MG tablet Take 81 mg by mouth daily.  . Azelaic Acid 15 % cream After skin is thoroughly washed and patted dry, gently but thoroughly massage a thin film of azelaic acid cream into the affected area twice daily, in the morning and evening.  . Calcium-Vitamin D 600-200 MG-UNIT per tablet Take 1 tablet by mouth daily.  . cholecalciferol (VITAMIN D3) 25 MCG (1000 UT) tablet Take 1,000 Units by mouth daily.  Marland Kitchen escitalopram (LEXAPRO) 10 MG tablet Take 1 tablet (10 mg total) by mouth daily.  Marland Kitchen ibuprofen (ADVIL) 200 MG tablet Take 200 mg by mouth every 6  (six) hours as needed for headache.  . Multiple Vitamins-Minerals (HAIR/SKIN/NAILS PO) Take 1 tablet by mouth daily.  . Omega-3 Fatty Acids (FISH OIL) 1000 MG CAPS Take 1,000 mg by mouth daily.   . vitamin E 400 UNIT capsule Take 400 Units by mouth daily.                 Past Medical History:  Diagnosis Date  . ADD (attention deficit disorder)   . Anxiety   . Asthma   . Bartholin gland cyst   . Colon polyp    adenomatous  . Diverticulosis   . Elevated LFTs   . GERD (gastroesophageal reflux disease)   . Hemorrhoids   . Hyperlipidemia   . Hypothyroidism   . Perimenopause   . Varicose veins        Objective:     09/02/2019        147   07/19/19 142 lb (64.4 kg)  06/13/19 145 lb (65.8 kg)  06/05/19 143 lb (64.9 kg)        amb pleasant wf nad  BP 98/60 (BP Location: Left Arm, Cuff Size: Normal)   Pulse 96   Temp 97.6 F (36.4 C) (Temporal)   Ht 5' (1.524 m)   Wt 147 lb (66.7 kg)   LMP 12/01/2016   SpO2 99% Comment: on RA  BMI 28.71 kg/m      LUNGS: no acc muscle use,  Nl contour chest with decreased bs/dullness R base 1/4 up   without cough on insp or exp maneuvers   HEENT : pt wearing mask not removed for exam due to covid -19 concerns.    NECK :  without JVD/Nodes/TM/ nl carotid upstrokes bilaterally   LUNGS: no acc muscle use,  Nl contour chest with decreased bs R base with dullness x  without cough on insp or exp maneuvers   CV:  RRR  no s3 or murmur or increase in P2, and no edema   ABD:  soft and nontender with nl inspiratory excursion in the supine position. No bruits or organomegaly appreciated, bowel sounds nl  MS:  Nl gait/ ext warm without deformities, calf tenderness, cyanosis or clubbing No obvious joint restrictions   SKIN: warm and dry without lesions    NEURO:  alert, approp, nl sensorium with  no motor or cerebellar deficits apparent.         Lab Results  Component Value Date   ESRSEDRATE 41 (H) 07/19/2019     Lab Results   Component Value Date   WBC 8.5 07/19/2019   HGB 13.3 07/19/2019   HCT 40.3 07/19/2019   MCV 86.1 07/19/2019   PLT 311.0 07/19/2019       EOS  0.6                                    07/19/2019      CXR PA and Lateral:   09/02/2019 :    I personally reviewed images and agree with radiology impression as follows:    No significant change in a moderate right pleural effusion with associated atelectasis or consolidation. The left lung is normally aerated. Assessment

## 2019-09-09 ENCOUNTER — Other Ambulatory Visit: Payer: Self-pay | Admitting: Thoracic Surgery (Cardiothoracic Vascular Surgery)

## 2019-09-09 DIAGNOSIS — J90 Pleural effusion, not elsewhere classified: Secondary | ICD-10-CM

## 2019-09-10 ENCOUNTER — Ambulatory Visit
Admission: RE | Admit: 2019-09-10 | Discharge: 2019-09-10 | Disposition: A | Discharge status: Home / Self Care | Payer: BC Managed Care – PPO | Source: Ambulatory Visit | Attending: Thoracic Surgery (Cardiothoracic Vascular Surgery) | Admitting: Thoracic Surgery (Cardiothoracic Vascular Surgery)

## 2019-09-10 ENCOUNTER — Institutional Professional Consult (permissible substitution) (INDEPENDENT_AMBULATORY_CARE_PROVIDER_SITE_OTHER): Payer: BC Managed Care – PPO | Admitting: Thoracic Surgery (Cardiothoracic Vascular Surgery)

## 2019-09-10 ENCOUNTER — Other Ambulatory Visit: Payer: Self-pay

## 2019-09-10 VITALS — BP 136/90 | HR 87 | Temp 97.7°F | Resp 16 | Ht 66.0 in | Wt 145.6 lb

## 2019-09-10 DIAGNOSIS — J90 Pleural effusion, not elsewhere classified: Secondary | ICD-10-CM

## 2019-09-10 NOTE — Progress Notes (Signed)
PCP is Colon Branch, MD Referring Provider is Tanda Rockers, MD  Chief Complaint  Patient presents with  . Pleural Effusion    Consultation    HPI: Denise Benitez is sent for consultation regarding a right pleural effusion.  Denise Benitez is a 53 year old woman originally from [place] with a past medical history significant for tobacco abuse, childhood asthma, pneumonia, reflux, anxiety, hyperlipidemia, hypothyroidism, and attention deficit disorder.  She had pneumonia involving the lingula back in March 2020.  She recovered from that and then did well until August 2020.  She developed dyspnea with exertion.  She quit smoking.  A chest x-ray showed a right pleural effusion.  She had a thoracentesis which drained 1.5 L of fluid.  Cytology was negative.  The fluid was exudative.  She saw Dr. Melvyn Novas again in September.  On chest x-ray the fluid had partially reaccumulated.  More recently her symptoms have improved but she saw Dr. Melvyn Novas again about a week ago and the pleural effusion was still present.  She was started on prednisone 20 mg daily.  She says that she is not having any shortness of breath or unusual cough.  She has no orthopnea.  She is not having any fevers, chills, or sweats.   Her primary language is Spanish, but she is fluent in [place].  She is accompanied by her niece who is a Marine scientist and is bilingual. Denise Benitez: [x]     0    Normal activity, no symptoms []     1    Restricted in physical strenuous activity but ambulatory, able to do out light work []     2    Ambulatory and capable of self care, unable to do work activities, up and about >50 % of waking hours                              []     3    Only limited self care, in bed greater than 50% of waking hours []     4    Completely disabled, no self care, confined to bed or chair []     5    Moribund  Past Medical History:   Diagnosis Date  . ADD (attention deficit disorder)   . Anxiety   . Asthma   . Bartholin gland cyst   . Colon polyp    adenomatous  . Diverticulosis   . Elevated LFTs   . GERD (gastroesophageal reflux disease)   . Hemorrhoids   . Hyperlipidemia   . Hypothyroidism   . Perimenopause   . Varicose veins     Past Surgical History:  Procedure Laterality Date  . CHOLECYSTECTOMY  1995  . OVARIAN CYST REMOVAL Right   . PELVIC LAPAROSCOPY     ovarian cystectomy  . VEIN SURGERY  1993    Family History  Problem Relation Age of Onset  . Hypertension Father   . Liver disease Father   . Hypertension Paternal Aunt   . Stomach cancer Paternal Uncle        mets  . Hypertension Paternal Uncle   . Breast cancer Paternal Grandmother   . Hypertension Paternal Grandmother   . Colon polyps Paternal Aunt   . Colon cancer Neg Hx     Social History Social History   Tobacco Use  . Smoking status: Former Smoker  Packs/day: 0.50    Years: 40.00    Pack years: 20.00    Types: Cigarettes    Quit date: 05/2019    Years since quitting: 0.3  . Smokeless tobacco: Never Used  Substance Use Topics  . Alcohol use: Yes    Alcohol/week: 0.0 standard drinks    Comment: weekends 10  . Drug use: No    Current Outpatient Medications  Medication Sig Dispense Refill  . Ascorbic Acid (VITAMIN C) 1000 MG tablet Take 500 mg by mouth 2 (two) times daily.     Marland Kitchen aspirin 81 MG tablet Take 81 mg by mouth daily.    . Calcium-Vitamin D 600-200 MG-UNIT per tablet Take 1 tablet by mouth daily.    . cholecalciferol (VITAMIN D3) 25 MCG (1000 UT) tablet Take 1,000 Units by mouth daily.    Marland Kitchen ibuprofen (ADVIL) 200 MG tablet Take 200 mg by mouth every 6 (six) hours Benitez needed for headache.    . Multiple Vitamins-Minerals (HAIR/SKIN/NAILS PO) Take 1 tablet by mouth daily.    . Omega-3 Fatty Acids (FISH OIL) 1000 MG CAPS Take 1,000 mg by mouth daily.     . predniSONE (DELTASONE) 10 MG tablet Take  2 each am 60  tablet 0  . vitamin E 400 UNIT capsule Take 400 Units by mouth daily.    . Azelaic Acid 15 % cream After skin is thoroughly washed and patted dry, gently but thoroughly massage a thin film of azelaic acid cream into the affected area twice daily, in the morning and evening. (Patient not taking: Reported on 09/10/2019) 30 g 0  . escitalopram (LEXAPRO) 10 MG tablet Take 1 tablet (10 mg total) by mouth daily. (Patient not taking: Reported on 09/10/2019) 30 tablet 0   No current facility-administered medications for this visit.     Allergies  Allergen Reactions  . Codeine Itching    Review of Systems  Constitutional: Positive for unexpected weight change (Lost 20 pounds, but has gained 10 pounds back). Negative for activity change.  HENT: Negative for trouble swallowing and voice change.   Respiratory: Positive for shortness of breath (With exertion, improving). Negative for cough and wheezing.   Cardiovascular: Negative for chest pain and leg swelling.  Neurological: Positive for dizziness.       Memory problems  Hematological: Bruises/bleeds easily.  Psychiatric/Behavioral: Positive for dysphoric mood. The patient is nervous/anxious.     BP 136/90 (BP Location: Right Arm)   Pulse 87   Temp 97.7 F (36.5 C) (Skin)   Resp 16   Ht 5\' 6"  (1.676 m)   Wt 145 lb 9.6 oz (66 kg)   LMP 12/01/2016   SpO2 95% Comment: RA  BMI 23.50 kg/m  Physical Exam Vitals signs reviewed.  Constitutional:      General: She is not in acute distress.    Appearance: Normal appearance.  HENT:     Head: Normocephalic and atraumatic.  Eyes:     General: No scleral icterus.    Extraocular Movements: Extraocular movements intact.  Neck:     Musculoskeletal: Neck supple.  Cardiovascular:     Rate and Rhythm: Normal rate and regular rhythm.     Pulses: Normal pulses.     Heart sounds: Normal heart sounds. No murmur.  Pulmonary:     Effort: Pulmonary effort is normal. No respiratory distress.     Breath  sounds: No wheezing or rales.     Comments: Diminished breath sounds right base Abdominal:  General: There is no distension.     Palpations: Abdomen is soft.     Tenderness: There is no abdominal tenderness.  Musculoskeletal:        General: No swelling.  Lymphadenopathy:     Cervical: No cervical adenopathy.  Skin:    General: Skin is warm and dry.  Neurological:     General: No focal deficit present.     Mental Status: She is alert and oriented to person, place, and time.     Cranial Nerves: No cranial nerve deficit.     Motor: No weakness.     Gait: Gait normal.    Diagnostic Tests: CHEST - 2 VIEW  COMPARISON:  09/02/2019.  FINDINGS: Moderate right pleural effusion, grossly unchanged. Associated lower lobe opacity, likely atelectasis. Left lung is notable for calcified left upper lobe granuloma, otherwise clear. No pneumothorax.  The heart is normal in size.  Visualized osseous structures are within normal limits.  Cholecystectomy clips.  IMPRESSION: Moderate right pleural effusion, grossly unchanged. Associated lower lobe opacity, likely atelectasis.   Electronically Signed   By: Julian Hy M.D.   On: 09/10/2019 12:08 I personally reviewed the chest x-ray images and concur with the findings noted above  Impression: Denise Benitez is a 53 year old woman with a history of moderate tobacco use, asthma, pneumonia, reflux, anxiety, hyperlipidemia, hypothyroidism, and attention deficit disorder.  She was found in August to have a right pleural effusion.  She had a thoracentesis which drained 1.5 L of fluid.  There was a small amount of residual fluid present.  Cytologies were negative.  The fluid was an exudate.  A repeat chest x-ray in September showed reaccumulation of the fluid.  It was still present on a chest x-ray a week ago.  Today's chest x-ray shows little if any change.  I reviewed the chest x-ray images with Denise Benitez and her niece.  We  discussed the differential diagnosis.  This is most likely a postinfectious exudative effusion.  Malignancy or other inflammatory causes are also possible.  We discussed 2 potential options.  The first would be a repeat thoracentesis and then see if the fluid reaccumulates while on prednisone.  The second, more aggressive option, would be to proceed with right VATS for drainage of the effusion and decortication.  I suspect given that this effusion has been present for 3 months now that she will end up needing a VATS, but it is not unreasonable to try another thoracentesis first.  I discussed the proposed surgical procedure with them.  That would be a right VATS for drainage of pleural effusion and decortication.  I informed them of the need for general anesthesia, the incisions to be used, the use of drainage tubes postoperatively, the expected hospital stay, and the overall recovery.  I informed them of the indications, risks, benefits, and alternatives.  They understand the risks include, but not limited to death, MI, DVT, PE, bleeding, possible need for transfusion, infection, prolonged air leak, cardiac arrhythmias, Benitez well Benitez the possibility of other unforeseeable complications.  I do think she is relatively low risk with the exception of bleeding and air leak.  She wishes to have some time to think over her options and discuss with her family.  I think that is fine.  There is no rush although we do need to deal with this eventually.  I also emphasized that there is really no downside to trying another thoracentesis, although I am not optimistic that that would completely solve  the problem.  Plan: She will call and let us know if she would like to proceed with thoracentesis or right VATS.  Melrose Nakayama, MD [facility] [phone]

## 2019-09-10 NOTE — H&P (View-Only) (Signed)
PCP is Colon Branch, MD Referring Provider is Tanda Rockers, MD  Chief Complaint  Patient presents with  . Pleural Effusion    Consultation    HPI: Denise Benitez is sent for consultation regarding a right pleural effusion.  Denise Benitez is a 53 year old woman originally from [place] with a past medical history significant for tobacco abuse, childhood asthma, pneumonia, reflux, anxiety, hyperlipidemia, hypothyroidism, and attention deficit disorder.  She had pneumonia involving the lingula back in March 2020.  She recovered from that and then did well until August 2020.  She developed dyspnea with exertion.  She quit smoking.  A chest x-ray showed a right pleural effusion.  She had a thoracentesis which drained 1.5 L of fluid.  Cytology was negative.  The fluid was exudative.  She saw Dr. Melvyn Novas again in September.  On chest x-ray the fluid had partially reaccumulated.  More recently her symptoms have improved but she saw Dr. Melvyn Novas again about a week ago and the pleural effusion was still present.  She was started on prednisone 20 mg daily.  She says that she is not having any shortness of breath or unusual cough.  She has no orthopnea.  She is not having any fevers, chills, or sweats.   Her primary language is Spanish, but she is fluent in [place].  She is accompanied by her niece who is a Marine scientist and is bilingual. Zubrod Score: At the time of surgery this patient's most appropriate activity status/level should be described as: [x]     0    Normal activity, no symptoms []     1    Restricted in physical strenuous activity but ambulatory, able to do out light work []     2    Ambulatory and capable of self care, unable to do work activities, up and about >50 % of waking hours                              []     3    Only limited self care, in bed greater than 50% of waking hours []     4    Completely disabled, no self care, confined to bed or chair []     5    Moribund  Past Medical History:   Diagnosis Date  . ADD (attention deficit disorder)   . Anxiety   . Asthma   . Bartholin gland cyst   . Colon polyp    adenomatous  . Diverticulosis   . Elevated LFTs   . GERD (gastroesophageal reflux disease)   . Hemorrhoids   . Hyperlipidemia   . Hypothyroidism   . Perimenopause   . Varicose veins     Past Surgical History:  Procedure Laterality Date  . CHOLECYSTECTOMY  1995  . OVARIAN CYST REMOVAL Right   . PELVIC LAPAROSCOPY     ovarian cystectomy  . VEIN SURGERY  1993    Family History  Problem Relation Age of Onset  . Hypertension Father   . Liver disease Father   . Hypertension Paternal Aunt   . Stomach cancer Paternal Uncle        mets  . Hypertension Paternal Uncle   . Breast cancer Paternal Grandmother   . Hypertension Paternal Grandmother   . Colon polyps Paternal Aunt   . Colon cancer Neg Hx     Social History Social History   Tobacco Use  . Smoking status: Former Smoker  Packs/day: 0.50    Years: 40.00    Pack years: 20.00    Types: Cigarettes    Quit date: 05/2019    Years since quitting: 0.3  . Smokeless tobacco: Never Used  Substance Use Topics  . Alcohol use: Yes    Alcohol/week: 0.0 standard drinks    Comment: weekends 10  . Drug use: No    Current Outpatient Medications  Medication Sig Dispense Refill  . Ascorbic Acid (VITAMIN C) 1000 MG tablet Take 500 mg by mouth 2 (two) times daily.     Marland Kitchen aspirin 81 MG tablet Take 81 mg by mouth daily.    . Calcium-Vitamin D 600-200 MG-UNIT per tablet Take 1 tablet by mouth daily.    . cholecalciferol (VITAMIN D3) 25 MCG (1000 UT) tablet Take 1,000 Units by mouth daily.    Marland Kitchen ibuprofen (ADVIL) 200 MG tablet Take 200 mg by mouth every 6 (six) hours as needed for headache.    . Multiple Vitamins-Minerals (HAIR/SKIN/NAILS PO) Take 1 tablet by mouth daily.    . Omega-3 Fatty Acids (FISH OIL) 1000 MG CAPS Take 1,000 mg by mouth daily.     . predniSONE (DELTASONE) 10 MG tablet Take  2 each am 60  tablet 0  . vitamin E 400 UNIT capsule Take 400 Units by mouth daily.    . Azelaic Acid 15 % cream After skin is thoroughly washed and patted dry, gently but thoroughly massage a thin film of azelaic acid cream into the affected area twice daily, in the morning and evening. (Patient not taking: Reported on 09/10/2019) 30 g 0  . escitalopram (LEXAPRO) 10 MG tablet Take 1 tablet (10 mg total) by mouth daily. (Patient not taking: Reported on 09/10/2019) 30 tablet 0   No current facility-administered medications for this visit.     Allergies  Allergen Reactions  . Codeine Itching    Review of Systems  Constitutional: Positive for unexpected weight change (Lost 20 pounds, but has gained 10 pounds back). Negative for activity change.  HENT: Negative for trouble swallowing and voice change.   Respiratory: Positive for shortness of breath (With exertion, improving). Negative for cough and wheezing.   Cardiovascular: Negative for chest pain and leg swelling.  Neurological: Positive for dizziness.       Memory problems  Hematological: Bruises/bleeds easily.  Psychiatric/Behavioral: Positive for dysphoric mood. The patient is nervous/anxious.     BP 136/90 (BP Location: Right Arm)   Pulse 87   Temp 97.7 F (36.5 C) (Skin)   Resp 16   Ht 5\' 6"  (1.676 m)   Wt 145 lb 9.6 oz (66 kg)   LMP 12/01/2016   SpO2 95% Comment: RA  BMI 23.50 kg/m  Physical Exam Vitals signs reviewed.  Constitutional:      General: She is not in acute distress.    Appearance: Normal appearance.  HENT:     Head: Normocephalic and atraumatic.  Eyes:     General: No scleral icterus.    Extraocular Movements: Extraocular movements intact.  Neck:     Musculoskeletal: Neck supple.  Cardiovascular:     Rate and Rhythm: Normal rate and regular rhythm.     Pulses: Normal pulses.     Heart sounds: Normal heart sounds. No murmur.  Pulmonary:     Effort: Pulmonary effort is normal. No respiratory distress.     Breath  sounds: No wheezing or rales.     Comments: Diminished breath sounds right base Abdominal:  General: There is no distension.     Palpations: Abdomen is soft.     Tenderness: There is no abdominal tenderness.  Musculoskeletal:        General: No swelling.  Lymphadenopathy:     Cervical: No cervical adenopathy.  Skin:    General: Skin is warm and dry.  Neurological:     General: No focal deficit present.     Mental Status: She is alert and oriented to person, place, and time.     Cranial Nerves: No cranial nerve deficit.     Motor: No weakness.     Gait: Gait normal.    Diagnostic Tests: CHEST - 2 VIEW  COMPARISON:  09/02/2019.  FINDINGS: Moderate right pleural effusion, grossly unchanged. Associated lower lobe opacity, likely atelectasis. Left lung is notable for calcified left upper lobe granuloma, otherwise clear. No pneumothorax.  The heart is normal in size.  Visualized osseous structures are within normal limits.  Cholecystectomy clips.  IMPRESSION: Moderate right pleural effusion, grossly unchanged. Associated lower lobe opacity, likely atelectasis.   Electronically Signed   By: Julian Hy M.D.   On: 09/10/2019 12:08 I personally reviewed the chest x-ray images and concur with the findings noted above  Impression: Denise Benitez is a 53 year old woman with a history of moderate tobacco use, asthma, pneumonia, reflux, anxiety, hyperlipidemia, hypothyroidism, and attention deficit disorder.  She was found in August to have a right pleural effusion.  She had a thoracentesis which drained 1.5 L of fluid.  There was a small amount of residual fluid present.  Cytologies were negative.  The fluid was an exudate.  A repeat chest x-ray in September showed reaccumulation of the fluid.  It was still present on a chest x-ray a week ago.  Today's chest x-ray shows little if any change.  I reviewed the chest x-ray images with Mrs. Laplume and her niece.  We  discussed the differential diagnosis.  This is most likely a postinfectious exudative effusion.  Malignancy or other inflammatory causes are also possible.  We discussed 2 potential options.  The first would be a repeat thoracentesis and then see if the fluid reaccumulates while on prednisone.  The second, more aggressive option, would be to proceed with right VATS for drainage of the effusion and decortication.  I suspect given that this effusion has been present for 3 months now that she will end up needing a VATS, but it is not unreasonable to try another thoracentesis first.  I discussed the proposed surgical procedure with them.  That would be a right VATS for drainage of pleural effusion and decortication.  I informed them of the need for general anesthesia, the incisions to be used, the use of drainage tubes postoperatively, the expected hospital stay, and the overall recovery.  I informed them of the indications, risks, benefits, and alternatives.  They understand the risks include, but not limited to death, MI, DVT, PE, bleeding, possible need for transfusion, infection, prolonged air leak, cardiac arrhythmias, as well as the possibility of other unforeseeable complications.  I do think she is relatively low risk with the exception of bleeding and air leak.  She wishes to have some time to think over her options and discuss with her family.  I think that is fine.  There is no rush although we do need to deal with this eventually.  I also emphasized that there is really no downside to trying another thoracentesis, although I am not optimistic that that would completely solve  the problem.  Plan: She will call and let us know if she would like to proceed with thoracentesis or right VATS.  Melrose Nakayama, MD [facility] [phone]

## 2019-09-12 ENCOUNTER — Encounter: Payer: BC Managed Care – PPO | Admitting: Thoracic Surgery (Cardiothoracic Vascular Surgery)

## 2019-09-12 ENCOUNTER — Encounter: Payer: Self-pay | Admitting: *Deleted

## 2019-09-12 ENCOUNTER — Other Ambulatory Visit: Payer: Self-pay | Admitting: *Deleted

## 2019-09-12 DIAGNOSIS — J90 Pleural effusion, not elsewhere classified: Secondary | ICD-10-CM

## 2019-09-13 NOTE — Progress Notes (Signed)
Per Levonne Spiller, patient ok to continue ASA and hold day of surgery.

## 2019-09-13 NOTE — Progress Notes (Addendum)
[id number] - [place], [place] [address] Phone: [phone] Fax: [phone]      Your procedure is scheduled on Wednesday 09/18/2019.  Report to [facility] Main Entrance "A" at 10:00 A.M., and check in at the Admitting office.  Call this number if you have problems the morning of surgery:  [phone]  Call [phone] if you have any questions prior to your surgery date Monday-Friday 8am-4pm    Remember:  Do not eat or drink after midnight the night before your surgery    Take these medicines the morning of surgery with A SIP OF WATER: Fluticasone (Flonase) nasal spray - if needed   7 days prior to surgery STOP taking any Aleve, Naproxen, Ibuprofen, Motrin, Advil, Goody's, BC's, all herbal medications, fish oil, and all vitamins.  Continue your aspirin until the day of surgery but do not take the day of surgery.   The Morning of Surgery  Do not wear jewelry, make-up or nail polish.  Do not wear lotions, powders, perfumes, or deodorant  Do not shave 48 hours prior to surgery.  Men may shave face and neck.  Do not bring valuables to the hospital.  [facility] is not responsible for any belongings or valuables.  If you are a smoker, DO NOT Smoke 24 hours prior to surgery  If you wear a CPAP at night please bring your mask, tubing, and machine the morning of surgery   Remember that you must have someone to transport you home after your surgery, and remain with you for 24 hours if you are discharged the same day.   Contacts, glasses, hearing aids, dentures or bridgework may not be worn into surgery.    Leave your suitcase in the car.  After surgery it may be brought to your room.  For patients admitted to the hospital, discharge time will be determined by your treatment team.  Patients discharged the day of surgery will not be allowed to drive home.    Special instructions:   [facility]- Preparing For  Surgery  Before surgery, you can play an important role. Because skin is not sterile, your skin needs to be as free of germs as possible. You can reduce the number of germs on your skin by washing with CHG (chlorahexidine gluconate) Soap before surgery.  CHG is an antiseptic cleaner which kills germs and bonds with the skin to continue killing germs even after washing.    Oral Hygiene is also important to reduce your risk of infection.  Remember - BRUSH YOUR TEETH THE MORNING OF SURGERY WITH YOUR REGULAR TOOTHPASTE  Please do not use if you have an allergy to CHG or antibacterial soaps. If your skin becomes reddened/irritated stop using the CHG.  Do not shave (including legs and underarms) for at least 48 hours prior to first CHG shower. It is OK to shave your face.  Please follow these instructions carefully.   1. Shower the NIGHT BEFORE SURGERY and the MORNING OF SURGERY with CHG Soap.   2. If you chose to wash your hair, wash your hair first as usual with your normal shampoo.  3. After you shampoo, rinse your hair and body thoroughly to remove the shampoo.  4. Use CHG as you would any other liquid soap. You can apply CHG directly to the skin and wash gently with a scrungie or a clean washcloth.   5. Apply the CHG Soap to your body ONLY FROM THE NECK  DOWN.  Do not use on open wounds or open sores. Avoid contact with your eyes, ears, mouth and genitals (private parts). Wash Face and genitals (private parts)  with your normal soap.   6. Wash thoroughly, paying special attention to the area where your surgery will be performed.  7. Thoroughly rinse your body with warm water from the neck down.  8. DO NOT shower/wash with your normal soap after using and rinsing off the CHG Soap.  9. Pat yourself dry with a CLEAN TOWEL.  10. Wear CLEAN PAJAMAS to bed the night before surgery, wear comfortable clothes the morning of surgery  11. Place CLEAN SHEETS on your bed the night of your first  shower and DO NOT SLEEP WITH PETS.    Day of Surgery:  Please shower the morning of surgery with the CHG soap  Do not apply any deodorants/lotions. Please wear clean clothes to the hospital/surgery center.   Remember to brush your teeth WITH YOUR REGULAR TOOTHPASTE.   Please read over the following fact sheets that you were given.

## 2019-09-16 ENCOUNTER — Encounter (HOSPITAL_COMMUNITY): Payer: Self-pay

## 2019-09-16 ENCOUNTER — Other Ambulatory Visit: Payer: Self-pay

## 2019-09-16 ENCOUNTER — Ambulatory Visit (HOSPITAL_COMMUNITY)
Admission: RE | Admit: 2019-09-16 | Discharge: 2019-09-16 | Disposition: A | Discharge status: Home / Self Care | Payer: BC Managed Care – PPO | Source: Ambulatory Visit | Attending: Thoracic Surgery (Cardiothoracic Vascular Surgery) | Admitting: Thoracic Surgery (Cardiothoracic Vascular Surgery)

## 2019-09-16 ENCOUNTER — Encounter (HOSPITAL_COMMUNITY): Payer: Self-pay | Admitting: Certified Registered Nurse Anesthetist

## 2019-09-16 ENCOUNTER — Encounter (HOSPITAL_COMMUNITY)
Admission: RE | Admit: 2019-09-16 | Discharge: 2019-09-16 | Disposition: A | Discharge status: Home / Self Care | Payer: BC Managed Care – PPO | Source: Ambulatory Visit | Attending: Thoracic Surgery (Cardiothoracic Vascular Surgery) | Admitting: Thoracic Surgery (Cardiothoracic Vascular Surgery)

## 2019-09-16 ENCOUNTER — Encounter (HOSPITAL_COMMUNITY): Payer: Self-pay | Admitting: Physician Assistant

## 2019-09-16 ENCOUNTER — Other Ambulatory Visit (HOSPITAL_COMMUNITY)
Admission: RE | Admit: 2019-09-16 | Discharge: 2019-09-16 | Disposition: A | Discharge status: Home / Self Care | Payer: BC Managed Care – PPO | Source: Ambulatory Visit | Attending: Thoracic Surgery (Cardiothoracic Vascular Surgery) | Admitting: Thoracic Surgery (Cardiothoracic Vascular Surgery)

## 2019-09-16 DIAGNOSIS — J90 Pleural effusion, not elsewhere classified: Secondary | ICD-10-CM

## 2019-09-16 DIAGNOSIS — F419 Anxiety disorder, unspecified: Secondary | ICD-10-CM | POA: Insufficient documentation

## 2019-09-16 DIAGNOSIS — Z7952 Long term (current) use of systemic steroids: Secondary | ICD-10-CM | POA: Insufficient documentation

## 2019-09-16 DIAGNOSIS — Z20828 Contact with and (suspected) exposure to other viral communicable diseases: Secondary | ICD-10-CM | POA: Diagnosis not present

## 2019-09-16 DIAGNOSIS — Z7982 Long term (current) use of aspirin: Secondary | ICD-10-CM | POA: Insufficient documentation

## 2019-09-16 DIAGNOSIS — E785 Hyperlipidemia, unspecified: Secondary | ICD-10-CM | POA: Insufficient documentation

## 2019-09-16 DIAGNOSIS — Z9049 Acquired absence of other specified parts of digestive tract: Secondary | ICD-10-CM | POA: Insufficient documentation

## 2019-09-16 DIAGNOSIS — R000 Tachycardia, unspecified: Secondary | ICD-10-CM | POA: Insufficient documentation

## 2019-09-16 DIAGNOSIS — Z01818 Encounter for other preprocedural examination: Secondary | ICD-10-CM | POA: Diagnosis not present

## 2019-09-16 DIAGNOSIS — Z79899 Other long term (current) drug therapy: Secondary | ICD-10-CM | POA: Insufficient documentation

## 2019-09-16 DIAGNOSIS — Z87891 Personal history of nicotine dependence: Secondary | ICD-10-CM | POA: Insufficient documentation

## 2019-09-16 HISTORY — DX: Other seasonal allergic rhinitis: J30.2

## 2019-09-16 HISTORY — DX: Pneumonia, unspecified organism: J18.9

## 2019-09-16 LAB — URINALYSIS, ROUTINE W REFLEX MICROSCOPIC
Bacteria, UA: NONE SEEN
Bilirubin Urine: NEGATIVE
Glucose, UA: >=500 mg/dL — AB
Hgb urine dipstick: NEGATIVE
Ketones, ur: 5 mg/dL — AB
Leukocytes,Ua: NEGATIVE
Nitrite: NEGATIVE
Protein, ur: NEGATIVE mg/dL
Specific Gravity, Urine: 1.025 (ref 1.005–1.030)
pH: 5 (ref 5.0–8.0)

## 2019-09-16 LAB — COMPREHENSIVE METABOLIC PANEL
ALT: 18 U/L (ref 0–44)
AST: 17 U/L (ref 15–41)
Albumin: 3.6 g/dL (ref 3.5–5.0)
Alkaline Phosphatase: 92 U/L (ref 38–126)
Anion gap: 15 (ref 5–15)
BUN: 23 mg/dL — ABNORMAL HIGH (ref 6–20)
CO2: 21 mmol/L — ABNORMAL LOW (ref 22–32)
Calcium: 9.6 mg/dL (ref 8.9–10.3)
Chloride: 102 mmol/L (ref 98–111)
Creatinine, Ser: 1.08 mg/dL — ABNORMAL HIGH (ref 0.44–1.00)
GFR calc Af Amer: >60 mL/min (ref >60)
GFR calc non Af Amer: 58 mL/min — ABNORMAL LOW (ref >60)
Glucose, Bld: 193 mg/dL — ABNORMAL HIGH (ref 70–99)
Potassium: 3.9 mmol/L (ref 3.5–5.1)
Sodium: 138 mmol/L (ref 135–145)
Total Bilirubin: 1.1 mg/dL (ref 0.3–1.2)
Total Protein: 7.3 g/dL (ref 6.5–8.1)

## 2019-09-16 LAB — BLOOD GAS, ARTERIAL
Acid-Base Excess: 1.2 mmol/L (ref 0.0–2.0)
Bicarbonate: 25.1 mmol/L (ref 20.0–28.0)
Drawn by: 421801
FIO2: 21
O2 Saturation: 97.4 %
Patient temperature: 37
pCO2 arterial: 38.4 mmHg (ref 32.0–48.0)
pH, Arterial: 7.431 (ref 7.350–7.450)
pO2, Arterial: 94.2 mmHg (ref 83.0–108.0)

## 2019-09-16 LAB — TYPE AND SCREEN
ABO/RH(D): A POS
Antibody Screen: NEGATIVE

## 2019-09-16 LAB — CBC
HCT: 41.1 % (ref 36.0–46.0)
Hemoglobin: 13.6 g/dL (ref 12.0–15.0)
MCH: 29.4 pg (ref 26.0–34.0)
MCHC: 33.1 g/dL (ref 30.0–36.0)
MCV: 88.8 fL (ref 80.0–100.0)
Platelets: 276 10*3/uL (ref 150–400)
RBC: 4.63 MIL/uL (ref 3.87–5.11)
RDW: 15.9 % — ABNORMAL HIGH (ref 11.5–15.5)
WBC: 9.9 10*3/uL (ref 4.0–10.5)
nRBC: 0 % (ref 0.0–0.2)

## 2019-09-16 LAB — ABO/RH: ABO/RH(D): A POS

## 2019-09-16 LAB — SARS CORONAVIRUS 2 (TAT 6-24 HRS): SARS Coronavirus 2: NEGATIVE

## 2019-09-16 LAB — SURGICAL PCR SCREEN
MRSA, PCR: NEGATIVE
Staphylococcus aureus: NEGATIVE

## 2019-09-16 LAB — PROTIME-INR
INR: 1.1 (ref 0.8–1.2)
Prothrombin Time: 13.7 seconds (ref 11.4–15.2)

## 2019-09-16 LAB — APTT: aPTT: 49 seconds — ABNORMAL HIGH (ref 24–36)

## 2019-09-16 NOTE — Progress Notes (Signed)
PCP - Dr. Larose Kells Cardiologist - patient denies Pulmonologist - Dr. Melvyn Novas  PPM/ICD - n/a Device Orders -  Rep Notified -   Chest x-ray - 09/16/2019 EKG - 09/16/2019 Stress Test - patient denies ECHO - patient denies Cardiac Cath - patient denies  Sleep Study - patient denies CPAP -   Fasting Blood Sugar - n/a Checks Blood Sugar _____ times a day  Blood Thinner Instructions: n/a Aspirin Instructions: Per Dr. Roxan Hockey, patient to continue ASA, hold DOS  Patient questioned if she should continue her prednisone prescribed by Dr. Melvyn Novas.  I reached out to Levonne Spiller, Therapist, sports.  She is to contact patient once she receives a response from Dr. Roxan Hockey.  ERAS Protcol - n/a PRE-SURGERY Ensure or G2-   COVID TEST- 09/16/2019   Anesthesia review: yes, hx of pneumonia, pleural effusion, tachycardia  Patient denies shortness of breath, fever, cough and chest pain at PAT appointment   All instructions explained to the patient, with a verbal understanding of the material. Patient agrees to go over the instructions while at home for a better understanding. Patient also instructed to self quarantine after being tested for COVID-19. The opportunity to ask questions was provided.

## 2019-09-17 ENCOUNTER — Other Ambulatory Visit: Payer: Self-pay | Admitting: *Deleted

## 2019-09-17 DIAGNOSIS — J90 Pleural effusion, not elsewhere classified: Secondary | ICD-10-CM

## 2019-09-17 NOTE — Anesthesia Preprocedure Evaluation (Deleted)
Anesthesia Evaluation    Airway        Dental   Pulmonary former smoker,           Cardiovascular      Neuro/Psych    GI/Hepatic   Endo/Other    Renal/GU      Musculoskeletal   Abdominal   Peds  Hematology   Anesthesia Other Findings   Reproductive/Obstetrics                             Anesthesia Physical Anesthesia Plan  ASA:   Anesthesia Plan:    Post-op Pain Management:    Induction:   PONV Risk Score and Plan:   Airway Management Planned:   Additional Equipment:   Intra-op Plan:   Post-operative Plan:   Informed Consent:   Plan Discussed with:   Anesthesia Plan Comments: (PAT note written 09/17/2019 by Myra Gianotti, PA-C. )        Anesthesia Quick Evaluation

## 2019-09-17 NOTE — Progress Notes (Signed)
Anesthesia Chart Review:  Case: [id number] Date/Time: 09/18/19 1146   Procedures:      VIDEO ASSISTED THORACOSCOPY (Right Chest)     DRAINAGE OF PLEURAL EFFUSION (Right )     DECORTICATION (Right )   Anesthesia type: General   Pre-op diagnosis: RIGHT PLEURAL EFFUSION   Location: [facility] OR ROOM 10 / [place] [facility]   Surgeon: Melrose Nakayama, MD      DISCUSSION: Patient is a 53 year old female scheduled for the above procedure. She was treated for left pneumonia 12/2018. She developed DOE in 06/2019 and was found to have a new right effusion 06/05/19 (s/p 1.5 L thoracentesis 06/19/19: reactive mesothelial cells). She had partial re-accumulation on her effusion and was started on prednisone. Pulmonology referred to CT surgery for further evaluation/management. Given effusion present for three months, VATS discussed (versus repeat thoracentesis while on prednisone and observe for re-accumulation). She ultimately opted for VATS. Recently quit smoking. QuantiFERON-TB Gold Plus negative. 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  Other history includes former smoker (quit 05/01/19), HLD, GERD, childhood asthma, ADD, elevated LFTs (history of; LFTs WNL 09/16/19). Normal thyroid panel 06/05/19.  Originally from [place].  PTT elevated at 49.  PT/INR WNL. Ryan at [facility] will review with Dr. Janifer Adie preoperative recommendations to surgeon, if any.   09/16/19 COVID-19 test negative. Anesthesia team to evaluate on the day of surgery. As of PAT, she was currently on prednisone 10 mg 2 tabs each morning.    VS: BP 117/64   Pulse (!) 104   Temp 36.7 C (Oral)   Resp 18   Ht 5\' 6"  (1.676 m)   Wt 66 kg   LMP 12/01/2016   SpO2 99%   BMI 23.48 kg/m    PROVIDERS: Colon Branch, MD is PCP (recently established 06/2019) Christinia Gully, MD is pulmonologist    LABS: Preoperative labs noted. Random glucose 193 (no history of DM, but is on prednisone currently). PTT 49--called to Ryan at [facility].  (all labs  ordered are listed, but only abnormal results are displayed)  Labs Reviewed  APTT - Abnormal; Notable for the following components:      Result Value   aPTT 49 (*)    All other components within normal limits  CBC - Abnormal; Notable for the following components:   RDW 15.9 (*)    All other components within normal limits  COMPREHENSIVE METABOLIC PANEL - Abnormal; Notable for the following components:   CO2 21 (*)    Glucose, Bld 193 (*)    BUN 23 (*)    Creatinine, Ser 1.08 (*)    GFR calc non Af Amer 58 (*)    All other components within normal limits  URINALYSIS, ROUTINE W REFLEX MICROSCOPIC - Abnormal; Notable for the following components:   Glucose, UA >=500 (*)    Ketones, ur 5 (*)    All other components within normal limits  SURGICAL PCR SCREEN  BLOOD GAS, ARTERIAL  PROTIME-INR  TYPE AND SCREEN  ABO/RH     IMAGES: CXR 09/16/19: IMPRESSION: 1. Moderate-sized right-sided pleural effusion, decreased in size from prior study. 2. Persistent opacification of the right lower lung zone which may represent atelectasis or consolidation. An underlying mass is not excluded on this exam.   EKG: 09/16/19: Sinus tachycardia at 106 bpm Left atrial enlargement Rightward axis Borderline ECG No significant change since last tracing Confirmed by Glori Bickers [phone]) on 09/16/2019 11:23:49 PM   CV: N/A  Past Medical History:  Diagnosis Date  .  ADD (attention deficit disorder)   . Anxiety   . Asthma    as a child, "no longer have it"  . Bartholin gland cyst   . Colon polyp    adenomatous  . Diverticulosis   . Elevated LFTs   . GERD (gastroesophageal reflux disease)   . Hemorrhoids   . Hyperlipidemia   . Hypothyroidism    patient denies  . Perimenopause   . Pneumonia   . Seasonal allergies   . Varicose veins     Past Surgical History:  Procedure Laterality Date  . CHOLECYSTECTOMY  1995  . OVARIAN CYST REMOVAL Right   . PELVIC LAPAROSCOPY     ovarian  cystectomy  . VEIN SURGERY  1993    MEDICATIONS: . aspirin 81 MG tablet  . Azelaic Acid 15 % cream  . Biotin 1000 MCG tablet  . cholecalciferol (VITAMIN D3) 25 MCG (1000 UT) tablet  . escitalopram (LEXAPRO) 10 MG tablet  . fluticasone (FLONASE) 50 MCG/ACT nasal spray  . Omega-3 Fatty Acids (FISH OIL) 1200 MG CAPS  . predniSONE (DELTASONE) 10 MG tablet  . vitamin C (ASCORBIC ACID) 500 MG tablet   No current facility-administered medications for this encounter.   Not currently taking Lexapro or Azelaic acid.   Myra Gianotti, PA-C Surgical Short Stay/Anesthesiology [facility] Phone [phone] [facility] Phone [phone] 09/17/2019 12:01 PM

## 2019-09-18 ENCOUNTER — Encounter (HOSPITAL_COMMUNITY)
Admission: RE | Admit: 2019-09-18 | Discharge: 2019-09-18 | Disposition: A | Discharge status: Home / Self Care | Payer: BC Managed Care – PPO | Source: Ambulatory Visit | Attending: Thoracic Surgery (Cardiothoracic Vascular Surgery) | Admitting: Thoracic Surgery (Cardiothoracic Vascular Surgery)

## 2019-09-18 ENCOUNTER — Encounter (HOSPITAL_COMMUNITY): Admission: RE | Payer: Self-pay | Source: Home / Self Care

## 2019-09-18 ENCOUNTER — Inpatient Hospital Stay (HOSPITAL_COMMUNITY)
Admission: RE | Admit: 2019-09-18 | Payer: BC Managed Care – PPO | Source: Home / Self Care | Admitting: Thoracic Surgery (Cardiothoracic Vascular Surgery)

## 2019-09-18 DIAGNOSIS — Z01812 Encounter for preprocedural laboratory examination: Secondary | ICD-10-CM | POA: Insufficient documentation

## 2019-09-18 DIAGNOSIS — J90 Pleural effusion, not elsewhere classified: Secondary | ICD-10-CM | POA: Insufficient documentation

## 2019-09-18 SURGERY — VIDEO ASSISTED THORACOSCOPY
Anesthesia: General | Site: Chest | Laterality: Right

## 2019-09-19 ENCOUNTER — Other Ambulatory Visit: Payer: Self-pay | Admitting: *Deleted

## 2019-09-19 ENCOUNTER — Encounter: Payer: Self-pay | Admitting: *Deleted

## 2019-09-19 DIAGNOSIS — J90 Pleural effusion, not elsewhere classified: Secondary | ICD-10-CM

## 2019-09-19 LAB — PTT FACTOR INHIBITOR (MIXING STUDY): aPTT: 32.5 s — ABNORMAL HIGH (ref 22.9–30.2)

## 2019-09-30 NOTE — Progress Notes (Addendum)
[id number] - [place], [address] [place] [address] Phone: [phone] Fax: [phone]                 Your procedure is scheduled on Thurs., Dec. 3, 2020 from 11:30AM-2:57PM             Report to [place] Entrance "A" at 9:30AM, and check in at the Admitting office.             Call this number if you have problems the morning of surgery:             [phone]  Call [phone] if you have any questions prior to your surgery date Monday-Friday 8am-4pm               Remember:             Do not eat or drink after midnight on Dec. 2nd                          Take these medicines the morning of surgery with A SIP OF WATER: Fluticasone (Flonase) nasal spray - if needed  Continue your aspirin until the day of surgery but do not take the day of surgery.  As of today, taking any Aleve, Naproxen, Ibuprofen, Motrin, Advil, Goody's, BC's, all herbal medications, fish oil, and all vitamins.              The Morning of Surgery             Do not wear jewelry, make-up or nail polish.             Do not wear lotions, powders, perfumes, or deodorant             Do not shave 48 hours prior to surgery.               Do not bring valuables to the hospital.             [facility] is not responsible for any belongings or valuables.  If you are a smoker, DO NOT Smoke 24 hours prior to surgery  If you wear a CPAP at night please bring your mask, tubing, and machine the morning of surgery   Remember that you must have someone to transport you home after your surgery, and remain with you for 24 hours if you are discharged the same day.   Contacts, glasses, hearing aids, dentures or bridgework may not be worn into surgery.   For patients admitted to the hospital, discharge time will be determined by your treatment team.  Patients discharged the day of surgery will not be allowed to drive home.    Special instructions:    [facility]- Preparing For Surgery  Before surgery, you can play an important role. Because skin is not sterile, your skin needs to be as free of germs as possible. You can reduce the number of germs on your skin by washing with CHG (chlorahexidine gluconate) Soap before surgery.  CHG is an antiseptic cleaner which kills germs and bonds with the skin to continue killing germs even after washing.    Please do not use if you have an allergy to CHG or antibacterial soaps. If your skin becomes reddened/irritated stop using the CHG.  Do not shave (including legs and underarms) for at least 48 hours prior to first CHG shower. It is OK to shave your  face.  Please follow these instructions carefully.                                                                                                                               1. Shower the NIGHT BEFORE SURGERY and the MORNING OF SURGERY with CHG Soap.   2. If you chose to wash your hair, wash your hair first as usual with your normal shampoo.  3. After you shampoo, rinse your hair and body thoroughly to remove the shampoo.  4. Use CHG as you would any other liquid soap. You can apply CHG directly to the skin and wash gently with a scrungie or a clean washcloth.   5. Apply the CHG Soap to your body ONLY FROM THE NECK DOWN.  Do not use on open wounds or open sores. Avoid contact with your eyes, ears, mouth and genitals (private parts). Wash Face and genitals (private parts)  with your normal soap.   6. Wash thoroughly, paying special attention to the area where your surgery will be performed.  7. Thoroughly rinse your body with warm water from the neck down.  8. DO NOT shower/wash with your normal soap after using and rinsing off the CHG Soap.  9. Pat yourself dry with a CLEAN TOWEL.  10. Wear CLEAN PAJAMAS to bed the night before surgery, wear comfortable clothes the morning of surgery  11. Place CLEAN SHEETS on your bed the night of  your first shower and DO NOT SLEEP WITH PETS.   Day of Surgery:  Please shower the morning of surgery with the CHG soap  Do not apply any deodorants/lotions. Please wear clean clothes to the hospital/surgery center.   Remember to brush your teeth WITH YOUR REGULAR TOOTHPASTE.  Please read over the following fact sheets that you were given.                                  Revision History

## 2019-10-01 ENCOUNTER — Other Ambulatory Visit (HOSPITAL_COMMUNITY)
Admission: RE | Admit: 2019-10-01 | Discharge: 2019-10-01 | Disposition: A | Discharge status: Home / Self Care | Payer: BC Managed Care – PPO | Source: Ambulatory Visit | Attending: Thoracic Surgery (Cardiothoracic Vascular Surgery) | Admitting: Thoracic Surgery (Cardiothoracic Vascular Surgery)

## 2019-10-01 ENCOUNTER — Other Ambulatory Visit: Payer: Self-pay

## 2019-10-01 ENCOUNTER — Ambulatory Visit (HOSPITAL_COMMUNITY)
Admission: RE | Admit: 2019-10-01 | Discharge: 2019-10-01 | Disposition: A | Discharge status: Home / Self Care | Payer: BC Managed Care – PPO | Source: Ambulatory Visit | Attending: Thoracic Surgery (Cardiothoracic Vascular Surgery) | Admitting: Thoracic Surgery (Cardiothoracic Vascular Surgery)

## 2019-10-01 ENCOUNTER — Encounter (HOSPITAL_COMMUNITY): Payer: Self-pay

## 2019-10-01 ENCOUNTER — Encounter (HOSPITAL_COMMUNITY)
Admission: RE | Admit: 2019-10-01 | Discharge: 2019-10-01 | Disposition: A | Discharge status: Home / Self Care | Payer: BC Managed Care – PPO | Source: Ambulatory Visit | Attending: Thoracic Surgery (Cardiothoracic Vascular Surgery) | Admitting: Thoracic Surgery (Cardiothoracic Vascular Surgery)

## 2019-10-01 DIAGNOSIS — Z86010 Personal history of colonic polyps: Secondary | ICD-10-CM | POA: Diagnosis not present

## 2019-10-01 DIAGNOSIS — J90 Pleural effusion, not elsewhere classified: Secondary | ICD-10-CM

## 2019-10-01 DIAGNOSIS — Z09 Encounter for follow-up examination after completed treatment for conditions other than malignant neoplasm: Secondary | ICD-10-CM | POA: Diagnosis not present

## 2019-10-01 DIAGNOSIS — J939 Pneumothorax, unspecified: Secondary | ICD-10-CM | POA: Diagnosis not present

## 2019-10-01 DIAGNOSIS — Z7952 Long term (current) use of systemic steroids: Secondary | ICD-10-CM | POA: Insufficient documentation

## 2019-10-01 DIAGNOSIS — Z7951 Long term (current) use of inhaled steroids: Secondary | ICD-10-CM | POA: Insufficient documentation

## 2019-10-01 DIAGNOSIS — Z87891 Personal history of nicotine dependence: Secondary | ICD-10-CM | POA: Diagnosis not present

## 2019-10-01 DIAGNOSIS — E039 Hypothyroidism, unspecified: Secondary | ICD-10-CM | POA: Diagnosis not present

## 2019-10-01 DIAGNOSIS — R791 Abnormal coagulation profile: Secondary | ICD-10-CM | POA: Insufficient documentation

## 2019-10-01 DIAGNOSIS — J45909 Unspecified asthma, uncomplicated: Secondary | ICD-10-CM | POA: Diagnosis not present

## 2019-10-01 DIAGNOSIS — K219 Gastro-esophageal reflux disease without esophagitis: Secondary | ICD-10-CM | POA: Diagnosis not present

## 2019-10-01 DIAGNOSIS — F419 Anxiety disorder, unspecified: Secondary | ICD-10-CM | POA: Diagnosis not present

## 2019-10-01 DIAGNOSIS — Z01818 Encounter for other preprocedural examination: Secondary | ICD-10-CM | POA: Insufficient documentation

## 2019-10-01 DIAGNOSIS — Z8371 Family history of colonic polyps: Secondary | ICD-10-CM | POA: Diagnosis not present

## 2019-10-01 DIAGNOSIS — R0602 Shortness of breath: Secondary | ICD-10-CM | POA: Diagnosis not present

## 2019-10-01 DIAGNOSIS — F988 Other specified behavioral and emotional disorders with onset usually occurring in childhood and adolescence: Secondary | ICD-10-CM | POA: Diagnosis not present

## 2019-10-01 DIAGNOSIS — E785 Hyperlipidemia, unspecified: Secondary | ICD-10-CM | POA: Diagnosis not present

## 2019-10-01 DIAGNOSIS — Z4682 Encounter for fitting and adjustment of non-vascular catheter: Secondary | ICD-10-CM | POA: Diagnosis not present

## 2019-10-01 DIAGNOSIS — Z79899 Other long term (current) drug therapy: Secondary | ICD-10-CM | POA: Diagnosis not present

## 2019-10-01 DIAGNOSIS — Z885 Allergy status to narcotic agent status: Secondary | ICD-10-CM | POA: Diagnosis not present

## 2019-10-01 DIAGNOSIS — Z20828 Contact with and (suspected) exposure to other viral communicable diseases: Secondary | ICD-10-CM | POA: Diagnosis not present

## 2019-10-01 DIAGNOSIS — Z7982 Long term (current) use of aspirin: Secondary | ICD-10-CM | POA: Diagnosis not present

## 2019-10-01 LAB — CBC
HCT: 40.7 % (ref 36.0–46.0)
Hemoglobin: 13.1 g/dL (ref 12.0–15.0)
MCH: 29.2 pg (ref 26.0–34.0)
MCHC: 32.2 g/dL (ref 30.0–36.0)
MCV: 90.8 fL (ref 80.0–100.0)
Platelets: 263 10*3/uL (ref 150–400)
RBC: 4.48 MIL/uL (ref 3.87–5.11)
RDW: 15.4 % (ref 11.5–15.5)
WBC: 7.2 10*3/uL (ref 4.0–10.5)
nRBC: 0 % (ref 0.0–0.2)

## 2019-10-01 LAB — URINALYSIS, ROUTINE W REFLEX MICROSCOPIC
Bilirubin Urine: NEGATIVE
Glucose, UA: NEGATIVE mg/dL
Hgb urine dipstick: NEGATIVE
Ketones, ur: NEGATIVE mg/dL
Nitrite: NEGATIVE
Protein, ur: NEGATIVE mg/dL
Specific Gravity, Urine: 1.008 (ref 1.005–1.030)
pH: 6 (ref 5.0–8.0)

## 2019-10-01 LAB — COMPREHENSIVE METABOLIC PANEL
ALT: 16 U/L (ref 0–44)
AST: 19 U/L (ref 15–41)
Albumin: 3.5 g/dL (ref 3.5–5.0)
Alkaline Phosphatase: 112 U/L (ref 38–126)
Anion gap: 13 (ref 5–15)
BUN: 13 mg/dL (ref 6–20)
CO2: 19 mmol/L — ABNORMAL LOW (ref 22–32)
Calcium: 9.5 mg/dL (ref 8.9–10.3)
Chloride: 103 mmol/L (ref 98–111)
Creatinine, Ser: 0.95 mg/dL (ref 0.44–1.00)
GFR calc Af Amer: >60 mL/min (ref >60)
GFR calc non Af Amer: >60 mL/min (ref >60)
Glucose, Bld: 92 mg/dL (ref 70–99)
Potassium: 4.3 mmol/L (ref 3.5–5.1)
Sodium: 135 mmol/L (ref 135–145)
Total Bilirubin: 0.7 mg/dL (ref 0.3–1.2)
Total Protein: 7.7 g/dL (ref 6.5–8.1)

## 2019-10-01 LAB — TYPE AND SCREEN
ABO/RH(D): A POS
Antibody Screen: NEGATIVE

## 2019-10-01 LAB — BLOOD GAS, ARTERIAL
Acid-base deficit: 0.7 mmol/L (ref 0.0–2.0)
Bicarbonate: 23.2 mmol/L (ref 20.0–28.0)
Drawn by: 421801
FIO2: 21
O2 Saturation: 98.1 %
Patient temperature: 37
pCO2 arterial: 36.9 mmHg (ref 32.0–48.0)
pH, Arterial: 7.415 (ref 7.350–7.450)
pO2, Arterial: 111 mmHg — ABNORMAL HIGH (ref 83.0–108.0)

## 2019-10-01 LAB — PROTIME-INR
INR: 1.1 (ref 0.8–1.2)
Prothrombin Time: 13.6 seconds (ref 11.4–15.2)

## 2019-10-01 LAB — APTT: aPTT: 54 seconds — ABNORMAL HIGH (ref 24–36)

## 2019-10-01 LAB — SARS CORONAVIRUS 2 (TAT 6-24 HRS): SARS Coronavirus 2: NEGATIVE

## 2019-10-01 NOTE — Progress Notes (Signed)
PCP - Dr. Larose Kells  Cardiologist - Denies  Chest x-ray - 10/01/2019  EKG - 09/16/2019 (E)  Stress Test - Denies  ECHO - Denies  Cardiac Cath - Denies  AICD-na PM-na LOOP-na  Sleep Study - Denies CPAP - Denies  LABS- 10/01/2019: CBC, CMP, ABG, PT ,PTT, T/S, UA, COVID  ASA-LD 12/2  ERAS- No  HA1C- Denies Fasting Blood Sugar -  Checks Blood Sugar _____ times a day  Anesthesia- Yes- previous consult  Pt denies having chest pain, sob, or fever at this time. All instructions explained to the pt, with a verbal understanding of the material. Pt agrees to go over the instructions while at home for a better understanding. Pt also instructed to self quarantine after being tested for COVID-19. The opportunity to ask questions was provided.   Coronavirus Screening  Have you experienced the following symptoms:  Cough yes/no: No Fever (>100.31F)  yes/no: No Runny nose yes/no: No Sore throat yes/no: No Difficulty breathing/shortness of breath  yes/no: No  Have you or a family member traveled in the last 14 days and where? yes/no: No   If the patient indicates "YES" to the above questions, their PAT will be rescheduled to limit the exposure to others and, the surgeon will be notified. THE PATIENT WILL NEED TO BE ASYMPTOMATIC FOR 14 DAYS.   If the patient is not experiencing any of these symptoms, the PAT nurse will instruct them to NOT bring anyone with them to their appointment since they may have these symptoms or traveled as well.   Please remind your patients and families that hospital visitation restrictions are in effect and the importance of the restrictions.

## 2019-10-01 NOTE — Progress Notes (Signed)
Anesthesia Chart Review:  Case: [id number] Date/Time: 10/03/19 1115   Procedures:      VIDEO ASSISTED THORACOSCOPY (Right Chest)     DRAINAGE OF PLEURAL EFFUSION (Right )     DECORTICATION (Right )   Anesthesia type: General   Pre-op diagnosis: RIGHT PLEURAL EFFUSION   Location: [facility] OR ROOM 51 / [facility]   Surgeon: Melrose Nakayama, MD      DISCUSSION: Patient is a 53 year old Benitez scheduled for the above procedure. Surgery was initially scheduled for 09/18/19, but was postponed due to elevated aPTT of 49 on 09/16/19. Mixing studies ordered, but repeat aPTT on 09/18/19 was only minimally elevated at 32.5, so normal plasma mixing studies could not be accurately interpreted so no further mixing tests performed. Surgery then rescheduled.   History includes former smoker (quit 05/01/19), left pneumonia (12/2018), right pleural effusion (s/p right thoracentesis 06/19/19: reactive mesothelial cells; partial reaccumulation despite steroids, referred to CT surgery), HLD, GERD, childhood asthma, ADD, elevated LFTs (history of; LFTs WNL 09/16/19). Normal thyroid panel 06/05/19. QuantiFERON-TB Gold Plus negative. 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 She is originally from [place].  PTT now elevated at 54, but as above, was also previously elevated. PT/INR WNL. UA with moderate leukocytes, negative nitrites, 11-20 WBCs. Will route to [facility] at [place] will review with Dr. Roxan Hockey to ensure no additional recommendations..  10/01/19 COVID-19 test negative.   Anesthesia team to evaluate on the day of surgery. As of PAT, she was currently on prednisone 10 mg 2 tabs each morning.     VS: BP 131/Denise   Pulse 92   Temp (!) 36.1 C (Oral)   Resp 17   Ht 5' 5.5" (1.664 m)   Wt 66.5 kg   LMP 12/01/2016   SpO2 96%   BMI 24.02 kg/m    PROVIDERS: Colon Branch, MD is PCP (recently established 06/2019) Christinia Gully, MD is pulmonologist    LABS: Preoperative labs reviewed. See DISCUSSION. (all  labs ordered are listed, but only abnormal results are displayed)  Labs Reviewed  APTT - Abnormal; Notable for the following components:      Result Value   aPTT 54 (*)    All other components within normal limits  BLOOD GAS, ARTERIAL - Abnormal; Notable for the following components:   pO2, Arterial 111 (*)    All other components within normal limits  COMPREHENSIVE METABOLIC PANEL - Abnormal; Notable for the following components:   CO2 19 (*)    All other components within normal limits  URINALYSIS, ROUTINE W REFLEX MICROSCOPIC - Abnormal; Notable for the following components:   Leukocytes,Ua MODERATE (*)    Bacteria, UA FEW (*)    Non Squamous Epithelial 0-5 (*)    All other components within normal limits  CBC  PROTIME-INR  TYPE AND SCREEN     IMAGES: CXR 10/01/19: FINDINGS: Moderate right pleural effusion. Right lower lobe atelectasis. Left lung clear. Heart is normal size. No acute bony abnormality. IMPRESSION: Stable moderate-sized right pleural effusion. Right base atelectasis. No significant change since prior study.   EKG: 09/16/19: Sinus tachycardia at 106 bpm Left atrial enlargement Rightward axis Borderline ECG No significant change since last tracing Confirmed by Glori Bickers [phone]) on 09/16/2019 11:23:49 PM   CV: N/A   Past Medical History:  Diagnosis Date  . ADD (attention deficit disorder)   . Anxiety   . Asthma    as a child, "no longer have it"  . Bartholin gland cyst   .  Colon polyp    adenomatous  . Diverticulosis   . Elevated LFTs   . GERD (gastroesophageal reflux disease)   . Hemorrhoids   . Hyperlipidemia   . Hypothyroidism    patient denies  . Perimenopause   . Pneumonia   . Seasonal allergies   . Varicose veins     Past Surgical History:  Procedure Laterality Date  . CHOLECYSTECTOMY  1995  . OVARIAN CYST REMOVAL Right   . PELVIC LAPAROSCOPY     ovarian cystectomy  . VEIN SURGERY  1993    MEDICATIONS: .  diphenhydrAMINE (BENADRYL) 25 MG tablet  . aspirin 81 MG tablet  . Azelaic Acid 15 % cream  . Biotin 1000 MCG tablet  . cholecalciferol (VITAMIN D3) 25 MCG (1000 UT) tablet  . escitalopram (LEXAPRO) 10 MG tablet  . fluticasone (FLONASE) 50 MCG/ACT nasal spray  . Omega-3 Fatty Acids (FISH OIL) 1200 MG CAPS  . predniSONE (DELTASONE) 10 MG tablet  . vitamin C (ASCORBIC ACID) 500 MG tablet   No current facility-administered medications for this encounter.   She is not currently taking azelaic acid cream, Lexapro,    Myra Gianotti, PA-C Surgical Short Stay/Anesthesiology [facility] Phone [phone] [facility] Phone [phone] 10/02/2019 7:52 AM

## 2019-10-03 ENCOUNTER — Encounter (HOSPITAL_COMMUNITY)
Admission: RE | Disposition: A | Discharge status: Home / Self Care | Payer: Self-pay | Source: Home / Self Care | Attending: Thoracic Surgery (Cardiothoracic Vascular Surgery)

## 2019-10-03 ENCOUNTER — Encounter (HOSPITAL_COMMUNITY): Payer: Self-pay

## 2019-10-03 ENCOUNTER — Inpatient Hospital Stay (HOSPITAL_COMMUNITY): Payer: BC Managed Care – PPO

## 2019-10-03 ENCOUNTER — Other Ambulatory Visit: Payer: Self-pay

## 2019-10-03 ENCOUNTER — Inpatient Hospital Stay (HOSPITAL_COMMUNITY): Payer: BC Managed Care – PPO | Admitting: Vascular Surgery

## 2019-10-03 ENCOUNTER — Inpatient Hospital Stay (HOSPITAL_COMMUNITY)
Admission: RE | Admit: 2019-10-03 | Discharge: 2019-10-07 | DRG: 188 | Disposition: A | Discharge status: Home / Self Care | Payer: BC Managed Care – PPO | Attending: Thoracic Surgery (Cardiothoracic Vascular Surgery) | Admitting: Thoracic Surgery (Cardiothoracic Vascular Surgery)

## 2019-10-03 DIAGNOSIS — E039 Hypothyroidism, unspecified: Secondary | ICD-10-CM | POA: Diagnosis present

## 2019-10-03 DIAGNOSIS — Z86010 Personal history of colonic polyps: Secondary | ICD-10-CM | POA: Diagnosis not present

## 2019-10-03 DIAGNOSIS — Z7982 Long term (current) use of aspirin: Secondary | ICD-10-CM

## 2019-10-03 DIAGNOSIS — Z09 Encounter for follow-up examination after completed treatment for conditions other than malignant neoplasm: Secondary | ICD-10-CM

## 2019-10-03 DIAGNOSIS — K219 Gastro-esophageal reflux disease without esophagitis: Secondary | ICD-10-CM | POA: Diagnosis present

## 2019-10-03 DIAGNOSIS — F419 Anxiety disorder, unspecified: Secondary | ICD-10-CM | POA: Diagnosis present

## 2019-10-03 DIAGNOSIS — Z8371 Family history of colonic polyps: Secondary | ICD-10-CM | POA: Diagnosis not present

## 2019-10-03 DIAGNOSIS — Z885 Allergy status to narcotic agent status: Secondary | ICD-10-CM

## 2019-10-03 DIAGNOSIS — E785 Hyperlipidemia, unspecified: Secondary | ICD-10-CM | POA: Diagnosis present

## 2019-10-03 DIAGNOSIS — J90 Pleural effusion, not elsewhere classified: Principal | ICD-10-CM | POA: Diagnosis present

## 2019-10-03 DIAGNOSIS — Z79899 Other long term (current) drug therapy: Secondary | ICD-10-CM | POA: Diagnosis not present

## 2019-10-03 DIAGNOSIS — F988 Other specified behavioral and emotional disorders with onset usually occurring in childhood and adolescence: Secondary | ICD-10-CM | POA: Diagnosis present

## 2019-10-03 DIAGNOSIS — Z20828 Contact with and (suspected) exposure to other viral communicable diseases: Secondary | ICD-10-CM | POA: Diagnosis present

## 2019-10-03 DIAGNOSIS — Z87891 Personal history of nicotine dependence: Secondary | ICD-10-CM

## 2019-10-03 DIAGNOSIS — Z4682 Encounter for fitting and adjustment of non-vascular catheter: Secondary | ICD-10-CM

## 2019-10-03 DIAGNOSIS — J45909 Unspecified asthma, uncomplicated: Secondary | ICD-10-CM | POA: Diagnosis present

## 2019-10-03 HISTORY — PX: DECORTICATION: SHX5101

## 2019-10-03 HISTORY — PX: PLEURAL EFFUSION DRAINAGE: SHX5099

## 2019-10-03 HISTORY — PX: VIDEO ASSISTED THORACOSCOPY: SHX5073

## 2019-10-03 LAB — GLUCOSE, CAPILLARY
Glucose-Capillary: 119 mg/dL — ABNORMAL HIGH (ref 70–99)
Glucose-Capillary: 125 mg/dL — ABNORMAL HIGH (ref 70–99)

## 2019-10-03 LAB — MRSA PCR SCREENING: MRSA by PCR: NEGATIVE

## 2019-10-03 SURGERY — VIDEO ASSISTED THORACOSCOPY
Anesthesia: General | Site: Chest | Laterality: Right

## 2019-10-03 MED ORDER — PROMETHAZINE HCL 25 MG/ML IJ SOLN: 6.2500 mg | INTRAMUSCULAR | Status: DC | PRN

## 2019-10-03 MED ORDER — PHENYLEPHRINE 40 MCG/ML (10ML) SYRINGE FOR IV PUSH (FOR BLOOD PRESSURE SUPPORT)
PREFILLED_SYRINGE | INTRAVENOUS | Status: AC
  Filled 2019-10-03: qty 10

## 2019-10-03 MED ORDER — CEFAZOLIN SODIUM-DEXTROSE 2-4 GM/100ML-% IV SOLN
2.0000 g | Freq: Three times a day (TID) | INTRAVENOUS | Status: AC
  Administered 2019-10-03 – 2019-10-04 (×2): 2 g via INTRAVENOUS
  Filled 2019-10-03 (×2): qty 100

## 2019-10-03 MED ORDER — BUPIVACAINE HCL (PF) 0.5 % IJ SOLN
INTRAMUSCULAR | Status: DC | PRN
  Administered 2019-10-03: 30 mL

## 2019-10-03 MED ORDER — SODIUM CHLORIDE 0.9 % IV SOLN
INTRAVENOUS | Status: DC
  Administered 2019-10-03 – 2019-10-04 (×3): via INTRAVENOUS

## 2019-10-03 MED ORDER — PROPOFOL 10 MG/ML IV BOLUS
INTRAVENOUS | Status: AC
  Filled 2019-10-03: qty 20

## 2019-10-03 MED ORDER — ROCURONIUM BROMIDE 10 MG/ML (PF) SYRINGE
PREFILLED_SYRINGE | INTRAVENOUS | Status: DC | PRN
  Administered 2019-10-03: 20 mg via INTRAVENOUS
  Administered 2019-10-03: 50 mg via INTRAVENOUS
  Administered 2019-10-03: 10 mg via INTRAVENOUS

## 2019-10-03 MED ORDER — SUGAMMADEX SODIUM 200 MG/2ML IV SOLN
INTRAVENOUS | Status: DC | PRN
  Administered 2019-10-03: 150 mg via INTRAVENOUS

## 2019-10-03 MED ORDER — INSULIN ASPART 100 UNIT/ML ~~LOC~~ SOLN
0.0000 [IU] | SUBCUTANEOUS | Status: DC
  Administered 2019-10-03: 2 [IU] via SUBCUTANEOUS

## 2019-10-03 MED ORDER — DIPHENHYDRAMINE HCL 50 MG/ML IJ SOLN
12.5000 mg | Freq: Four times a day (QID) | INTRAMUSCULAR | Status: DC | PRN
  Administered 2019-10-03: 12.5 mg via INTRAVENOUS
  Filled 2019-10-03: qty 1

## 2019-10-03 MED ORDER — FENTANYL CITRATE (PF) 250 MCG/5ML IJ SOLN
INTRAMUSCULAR | Status: AC
  Filled 2019-10-03: qty 5

## 2019-10-03 MED ORDER — FENTANYL CITRATE (PF) 100 MCG/2ML IJ SOLN
25.0000 ug | INTRAMUSCULAR | Status: DC | PRN
  Administered 2019-10-03 (×2): 50 ug via INTRAVENOUS

## 2019-10-03 MED ORDER — ONDANSETRON HCL 4 MG/2ML IJ SOLN
INTRAMUSCULAR | Status: AC
  Filled 2019-10-03: qty 2

## 2019-10-03 MED ORDER — DEXAMETHASONE SODIUM PHOSPHATE 10 MG/ML IJ SOLN
INTRAMUSCULAR | Status: AC
  Filled 2019-10-03: qty 1

## 2019-10-03 MED ORDER — FENTANYL CITRATE (PF) 100 MCG/2ML IJ SOLN
INTRAMUSCULAR | Status: DC | PRN
  Administered 2019-10-03: 50 ug via INTRAVENOUS
  Administered 2019-10-03: 100 ug via INTRAVENOUS
  Administered 2019-10-03 (×3): 50 ug via INTRAVENOUS

## 2019-10-03 MED ORDER — INFLUENZA VAC SPLIT QUAD 0.5 ML IM SUSY: 0.5000 mL | PREFILLED_SYRINGE | INTRAMUSCULAR | Status: DC

## 2019-10-03 MED ORDER — FENTANYL CITRATE (PF) 100 MCG/2ML IJ SOLN
INTRAMUSCULAR | Status: AC
  Filled 2019-10-03: qty 2

## 2019-10-03 MED ORDER — CEFAZOLIN SODIUM-DEXTROSE 2-4 GM/100ML-% IV SOLN
INTRAVENOUS | Status: AC
  Filled 2019-10-03: qty 100

## 2019-10-03 MED ORDER — PHENYLEPHRINE 40 MCG/ML (10ML) SYRINGE FOR IV PUSH (FOR BLOOD PRESSURE SUPPORT)
PREFILLED_SYRINGE | INTRAVENOUS | Status: DC | PRN
  Administered 2019-10-03 (×4): 80 ug via INTRAVENOUS

## 2019-10-03 MED ORDER — EPHEDRINE SULFATE-NACL 50-0.9 MG/10ML-% IV SOSY
PREFILLED_SYRINGE | INTRAVENOUS | Status: DC | PRN
  Administered 2019-10-03: 5 mg via INTRAVENOUS

## 2019-10-03 MED ORDER — ASPIRIN 81 MG PO CHEW
81.0000 mg | CHEWABLE_TABLET | Freq: Every day | ORAL | Status: DC
  Administered 2019-10-04 – 2019-10-07 (×4): 81 mg via ORAL
  Filled 2019-10-03 (×4): qty 1

## 2019-10-03 MED ORDER — DIPHENHYDRAMINE HCL 12.5 MG/5ML PO ELIX
12.5000 mg | ORAL_SOLUTION | Freq: Four times a day (QID) | ORAL | Status: DC | PRN
  Administered 2019-10-04 – 2019-10-05 (×2): 12.5 mg via ORAL
  Filled 2019-10-03 (×4): qty 5

## 2019-10-03 MED ORDER — ONDANSETRON HCL 4 MG/2ML IJ SOLN
INTRAMUSCULAR | Status: DC | PRN
  Administered 2019-10-03: 4 mg via INTRAVENOUS

## 2019-10-03 MED ORDER — FENTANYL 40 MCG/ML IV SOLN
INTRAVENOUS | Status: DC | PRN
  Administered 2019-10-03: 17:00:00 via INTRAVENOUS
  Administered 2019-10-04: 45 ug via INTRAVENOUS
  Administered 2019-10-04: 15 ug via INTRAVENOUS
  Administered 2019-10-04: 180 ug via INTRAVENOUS
  Administered 2019-10-04: 120 ug via INTRAVENOUS
  Administered 2019-10-04: 105 ug via INTRAVENOUS
  Administered 2019-10-04: 1000 ug via INTRAVENOUS
  Administered 2019-10-05: 60 ug via INTRAVENOUS
  Administered 2019-10-05: 75 ug via INTRAVENOUS
  Administered 2019-10-05: 138 ug via INTRAVENOUS
  Administered 2019-10-05: 45 ug via INTRAVENOUS
  Administered 2019-10-05: 53.03 ug via INTRAVENOUS
  Administered 2019-10-06: 75 ug via INTRAVENOUS
  Administered 2019-10-06: 285 ug via INTRAVENOUS
  Administered 2019-10-06: 30 ug via INTRAVENOUS
  Administered 2019-10-06: 18:00:00 via INTRAVENOUS
  Administered 2019-10-06: 60 ug via INTRAVENOUS
  Administered 2019-10-07: 75 ug via INTRAVENOUS
  Administered 2019-10-07: 15 ug via INTRAVENOUS
  Filled 2019-10-03: qty 25
  Filled 2019-10-03 (×2): qty 1000

## 2019-10-03 MED ORDER — NALOXONE HCL 0.4 MG/ML IJ SOLN: 0.4000 mg | INTRAMUSCULAR | Status: DC | PRN

## 2019-10-03 MED ORDER — MIDAZOLAM HCL 5 MG/5ML IJ SOLN
INTRAMUSCULAR | Status: DC | PRN
  Administered 2019-10-03 (×2): 2 mg via INTRAVENOUS

## 2019-10-03 MED ORDER — CEFAZOLIN SODIUM-DEXTROSE 2-4 GM/100ML-% IV SOLN
2.0000 g | INTRAVENOUS | Status: AC
  Administered 2019-10-03: 2 g via INTRAVENOUS

## 2019-10-03 MED ORDER — ACETAMINOPHEN 500 MG PO TABS
1000.0000 mg | ORAL_TABLET | Freq: Four times a day (QID) | ORAL | Status: DC
  Administered 2019-10-03 – 2019-10-07 (×14): 1000 mg via ORAL
  Filled 2019-10-03 (×14): qty 2

## 2019-10-03 MED ORDER — BUPIVACAINE HCL (PF) 0.5 % IJ SOLN
INTRAMUSCULAR | Status: AC
  Filled 2019-10-03: qty 30

## 2019-10-03 MED ORDER — CHLORHEXIDINE GLUCONATE CLOTH 2 % EX PADS
6.0000 | MEDICATED_PAD | Freq: Every day | CUTANEOUS | Status: DC
  Administered 2019-10-04 – 2019-10-06 (×3): 6 via TOPICAL

## 2019-10-03 MED ORDER — 0.9 % SODIUM CHLORIDE (POUR BTL) OPTIME
TOPICAL | Status: DC | PRN
  Administered 2019-10-03: 2000 mL

## 2019-10-03 MED ORDER — ACETAMINOPHEN 160 MG/5ML PO SOLN: 1000.0000 mg | Freq: Four times a day (QID) | ORAL | Status: DC

## 2019-10-03 MED ORDER — EPHEDRINE 5 MG/ML INJ
INTRAVENOUS | Status: AC
  Filled 2019-10-03: qty 10

## 2019-10-03 MED ORDER — ONDANSETRON HCL 4 MG/2ML IJ SOLN: 4.0000 mg | Freq: Four times a day (QID) | INTRAMUSCULAR | Status: DC | PRN

## 2019-10-03 MED ORDER — LACTATED RINGERS IV SOLN
INTRAVENOUS | Status: DC
  Administered 2019-10-03: 10:00:00 via INTRAVENOUS

## 2019-10-03 MED ORDER — TRAMADOL HCL 50 MG PO TABS
50.0000 mg | ORAL_TABLET | Freq: Four times a day (QID) | ORAL | Status: DC | PRN
  Administered 2019-10-03 – 2019-10-05 (×4): 100 mg via ORAL
  Administered 2019-10-05: 50 mg via ORAL
  Administered 2019-10-05 – 2019-10-07 (×5): 100 mg via ORAL
  Filled 2019-10-03 (×6): qty 2
  Filled 2019-10-03: qty 1
  Filled 2019-10-03 (×3): qty 2

## 2019-10-03 MED ORDER — ACETAMINOPHEN 500 MG PO TABS
1000.0000 mg | ORAL_TABLET | Freq: Once | ORAL | Status: AC
  Administered 2019-10-03: 1000 mg via ORAL
  Filled 2019-10-03: qty 2

## 2019-10-03 MED ORDER — PROPOFOL 10 MG/ML IV BOLUS
INTRAVENOUS | Status: DC | PRN
  Administered 2019-10-03: 20 mg via INTRAVENOUS
  Administered 2019-10-03: 30 mg via INTRAVENOUS
  Administered 2019-10-03: 150 mg via INTRAVENOUS

## 2019-10-03 MED ORDER — SODIUM CHLORIDE 0.9% FLUSH: 9.0000 mL | INTRAVENOUS | Status: DC | PRN

## 2019-10-03 MED ORDER — SENNOSIDES-DOCUSATE SODIUM 8.6-50 MG PO TABS
1.0000 | ORAL_TABLET | Freq: Every day | ORAL | Status: DC
  Administered 2019-10-04 – 2019-10-07 (×3): 1 via ORAL
  Filled 2019-10-03 (×3): qty 1

## 2019-10-03 MED ORDER — PREDNISONE 20 MG PO TABS
20.0000 mg | ORAL_TABLET | Freq: Every day | ORAL | Status: DC
  Administered 2019-10-04 – 2019-10-07 (×4): 20 mg via ORAL
  Filled 2019-10-03 (×5): qty 1

## 2019-10-03 MED ORDER — LACTATED RINGERS IV SOLN
INTRAVENOUS | Status: DC | PRN
  Administered 2019-10-03: 14:00:00 via INTRAVENOUS

## 2019-10-03 MED ORDER — SODIUM CHLORIDE 0.9 % IV SOLN
INTRAVENOUS | Status: DC | PRN
  Administered 2019-10-03: 15:00:00

## 2019-10-03 MED ORDER — SODIUM CHLORIDE (PF) 0.9 % IJ SOLN
INTRAMUSCULAR | Status: DC | PRN
  Administered 2019-10-03: 50 mL

## 2019-10-03 MED ORDER — MIDAZOLAM HCL 2 MG/2ML IJ SOLN
INTRAMUSCULAR | Status: AC
  Filled 2019-10-03: qty 2

## 2019-10-03 MED ORDER — PHENYLEPHRINE HCL-NACL 10-0.9 MG/250ML-% IV SOLN
INTRAVENOUS | Status: DC | PRN
  Administered 2019-10-03: 15 ug/min via INTRAVENOUS

## 2019-10-03 MED ORDER — BISACODYL 5 MG PO TBEC
10.0000 mg | DELAYED_RELEASE_TABLET | Freq: Every day | ORAL | Status: DC
  Administered 2019-10-03 – 2019-10-07 (×5): 10 mg via ORAL
  Filled 2019-10-03 (×5): qty 2

## 2019-10-03 MED ORDER — ENOXAPARIN SODIUM 40 MG/0.4ML ~~LOC~~ SOLN
40.0000 mg | Freq: Every day | SUBCUTANEOUS | Status: DC
  Administered 2019-10-04 – 2019-10-07 (×4): 40 mg via SUBCUTANEOUS
  Filled 2019-10-03 (×4): qty 0.4

## 2019-10-03 MED ORDER — SODIUM CHLORIDE 0.9 % IV SOLN: INTRAVENOUS | Status: DC | PRN

## 2019-10-03 MED ORDER — DEXAMETHASONE SODIUM PHOSPHATE 10 MG/ML IJ SOLN
INTRAMUSCULAR | Status: DC | PRN
  Administered 2019-10-03: 5 mg via INTRAVENOUS

## 2019-10-03 SURGICAL SUPPLY — 89 items
ADH SKN CLS APL DERMABOND .7 (GAUZE/BANDAGES/DRESSINGS) ×2
APL SWBSTK 6 STRL LF DISP (MISCELLANEOUS)
APPLICATOR COTTON TIP 6 STRL (MISCELLANEOUS) IMPLANT
APPLICATOR COTTON TIP 6IN STRL (MISCELLANEOUS)
APPLIER CLIP ROT 10 11.4 M/L (STAPLE)
APR CLP MED LRG 11.4X10 (STAPLE)
BAG SPEC RTRVL LRG 6X4 10 (ENDOMECHANICALS)
BLADE CLIPPER SURG (BLADE) ×4 IMPLANT
CANISTER SUCT 3000ML PPV (MISCELLANEOUS) ×4 IMPLANT
CATH THORACIC 28FR RT ANG (CATHETERS) IMPLANT
CATH THORACIC 36FR (CATHETERS) IMPLANT
CATH THORACIC 36FR RT ANG (CATHETERS) IMPLANT
CLIP APPLIE ROT 10 11.4 M/L (STAPLE) IMPLANT
CLIP VESOCCLUDE MED 6/CT (CLIP) ×3 IMPLANT
CONN ST 1/4X3/8  BEN (MISCELLANEOUS) ×2
CONN ST 1/4X3/8 BEN (MISCELLANEOUS) IMPLANT
CONN Y 3/8X3/8X3/8  BEN (MISCELLANEOUS) ×2
CONN Y 3/8X3/8X3/8 BEN (MISCELLANEOUS) ×2 IMPLANT
CONT SPEC 4OZ CLIKSEAL STRL BL (MISCELLANEOUS) ×12 IMPLANT
COVER SURGICAL LIGHT HANDLE (MISCELLANEOUS) ×4 IMPLANT
COVER WAND RF STERILE (DRAPES) ×1 IMPLANT
DERMABOND ADVANCED (GAUZE/BANDAGES/DRESSINGS) ×2
DERMABOND ADVANCED .7 DNX12 (GAUZE/BANDAGES/DRESSINGS) IMPLANT
DRAIN CHANNEL 28F RND 3/8 FF (WOUND CARE) ×4 IMPLANT
DRAIN CHANNEL 32F RND 10.7 FF (WOUND CARE) IMPLANT
DRAPE CV SPLIT W-CLR ANES SCRN (DRAPES) ×4 IMPLANT
DRAPE ORTHO SPLIT 77X108 STRL (DRAPES) ×4
DRAPE SLUSH/WARMER DISC (DRAPES) ×4 IMPLANT
DRAPE SURG ORHT 6 SPLT 77X108 (DRAPES) ×2 IMPLANT
ELECT BLADE 6.5 EXT (BLADE) ×4 IMPLANT
ELECT REM PT RETURN 9FT ADLT (ELECTROSURGICAL) ×4
ELECTRODE REM PT RTRN 9FT ADLT (ELECTROSURGICAL) ×2 IMPLANT
GAUZE SPONGE 4X4 12PLY STRL (GAUZE/BANDAGES/DRESSINGS) ×4 IMPLANT
GAUZE SPONGE 4X4 12PLY STRL LF (GAUZE/BANDAGES/DRESSINGS) ×2 IMPLANT
GLOVE SURG SIGNA 7.5 PF LTX (GLOVE) ×8 IMPLANT
GLOVE TRIUMPH SURG SIZE 7.5 (KITS) ×4 IMPLANT
GOWN STRL REUS W/ TWL LRG LVL3 (GOWN DISPOSABLE) ×4 IMPLANT
GOWN STRL REUS W/ TWL XL LVL3 (GOWN DISPOSABLE) ×2 IMPLANT
GOWN STRL REUS W/TWL LRG LVL3 (GOWN DISPOSABLE) ×8
GOWN STRL REUS W/TWL XL LVL3 (GOWN DISPOSABLE) ×4
HEMOSTAT SURGICEL 2X14 (HEMOSTASIS) IMPLANT
IV CATH 22GX1 FEP (IV SOLUTION) IMPLANT
KIT BASIN OR (CUSTOM PROCEDURE TRAY) ×4 IMPLANT
KIT SUCTION CATH 14FR (SUCTIONS) ×4 IMPLANT
KIT TURNOVER KIT B (KITS) ×4 IMPLANT
NDL HYPO 25GX1X1/2 BEV (NEEDLE) ×2 IMPLANT
NEEDLE HYPO 25GX1X1/2 BEV (NEEDLE) ×4 IMPLANT
NS IRRIG 1000ML POUR BTL (IV SOLUTION) ×8 IMPLANT
PACK CHEST (CUSTOM PROCEDURE TRAY) ×4 IMPLANT
PAD ARMBOARD 7.5X6 YLW CONV (MISCELLANEOUS) ×8 IMPLANT
POUCH ENDO CATCH II 15MM (MISCELLANEOUS) IMPLANT
POUCH SPECIMEN RETRIEVAL 10MM (ENDOMECHANICALS) IMPLANT
SEALANT PROGEL (MISCELLANEOUS) IMPLANT
SEALANT SURG COSEAL 4ML (VASCULAR PRODUCTS) IMPLANT
SEALANT SURG COSEAL 8ML (VASCULAR PRODUCTS) IMPLANT
SOL ANTI FOG 6CC (MISCELLANEOUS) ×2 IMPLANT
SOLUTION ANTI FOG 6CC (MISCELLANEOUS) ×2
SPECIMEN JAR MEDIUM (MISCELLANEOUS) ×4 IMPLANT
SPONGE INTESTINAL PEANUT (DISPOSABLE) IMPLANT
SPONGE TONSIL TAPE 1 RFD (DISPOSABLE) ×4 IMPLANT
STOPCOCK 4 WAY LG BORE MALE ST (IV SETS) ×4 IMPLANT
SUT PROLENE 4 0 RB 1 (SUTURE)
SUT PROLENE 4-0 RB1 .5 CRCL 36 (SUTURE) IMPLANT
SUT SILK  1 MH (SUTURE) ×4
SUT SILK 1 MH (SUTURE) ×4 IMPLANT
SUT SILK 2 0SH CR/8 30 (SUTURE) IMPLANT
SUT SILK 3 0SH CR/8 30 (SUTURE) IMPLANT
SUT VIC AB 1 CTX 36 (SUTURE) ×4
SUT VIC AB 1 CTX36XBRD ANBCTR (SUTURE) IMPLANT
SUT VIC AB 2-0 CTX 36 (SUTURE) ×3 IMPLANT
SUT VIC AB 2-0 UR6 27 (SUTURE) IMPLANT
SUT VIC AB 3-0 MH 27 (SUTURE) IMPLANT
SUT VIC AB 3-0 X1 27 (SUTURE) ×4 IMPLANT
SUT VICRYL 2 TP 1 (SUTURE) IMPLANT
SYR 10ML LL (SYRINGE) ×4 IMPLANT
SYR 20ML LL LF (SYRINGE) IMPLANT
SYR 50ML LL SCALE MARK (SYRINGE) ×4 IMPLANT
SYSTEM SAHARA CHEST DRAIN ATS (WOUND CARE) ×4 IMPLANT
TAPE CLOTH 4X10 WHT NS (GAUZE/BANDAGES/DRESSINGS) ×4 IMPLANT
TAPE CLOTH SURG 4X10 WHT LF (GAUZE/BANDAGES/DRESSINGS) ×3 IMPLANT
TIP APPLICATOR SPRAY EXTEND 16 (VASCULAR PRODUCTS) IMPLANT
TOWEL GREEN STERILE (TOWEL DISPOSABLE) ×4 IMPLANT
TOWEL GREEN STERILE FF (TOWEL DISPOSABLE) ×4 IMPLANT
TRAP SPECIMEN MUCOUS 40CC (MISCELLANEOUS) ×4 IMPLANT
TRAY FOLEY MTR SLVR 16FR STAT (SET/KITS/TRAYS/PACK) ×4 IMPLANT
TROCAR XCEL BLADELESS 5X75MML (TROCAR) ×4 IMPLANT
TROCAR XCEL NON-BLD 5MMX100MML (ENDOMECHANICALS) IMPLANT
TUBING EXTENTION W/L.L. (IV SETS) ×4 IMPLANT
WATER STERILE IRR 1000ML POUR (IV SOLUTION) ×8 IMPLANT

## 2019-10-03 NOTE — Anesthesia Procedure Notes (Signed)
Central Venous Catheter Insertion Performed by: Catalina Gravel, MD, anesthesiologist Start/End12/12/2018 10:55 AM, 10/03/2019 11:05 AM Preanesthetic checklist: patient identified, IV checked, site marked, risks and benefits discussed, surgical consent, monitors and equipment checked, pre-op evaluation, timeout performed and anesthesia consent Position: Trendelenburg Lidocaine 1% used for infiltration and patient sedated Hand hygiene performed , maximum sterile barriers used  and Seldinger technique used Catheter size: 8 Fr Total catheter length 16. Central line was placed.Double lumen Procedure performed using ultrasound guided technique. Ultrasound Notes:anatomy identified, needle tip was noted to be adjacent to the nerve/plexus identified, no ultrasound evidence of intravascular and/or intraneural injection and image(s) printed for medical record Attempts: 1 Following insertion, line sutured, dressing applied and Biopatch. Post procedure assessment: blood return through all ports, free fluid flow and no air  Patient tolerated the procedure well with no immediate complications.

## 2019-10-03 NOTE — Brief Op Note (Addendum)
      [address]       [place] [address]             [phone]       10/03/2019  3:55 PM  PATIENT:  Quentin Mulling  53 y.o. female  PRE-OPERATIVE DIAGNOSIS:  RIGHT PLEURAL EFFUSION  POST-OPERATIVE DIAGNOSIS:  RIGHT PLEURAL EFFUSION  PROCEDURE:   RIGHT VIDEO ASSISTED THORACOSCOPY DRAINAGE OF PLEURAL EFFUSION PLEURAL BIOPSIES  SURGEON:  Surgeon(s) and Role:    * Melrose Nakayama, MD - Primary  PHYSICIAN ASSISTANT: Nicholes Rough, PA-C  ANESTHESIA:   general  EBL:  50 mL   BLOOD ADMINISTERED:none  DRAINS: two blake drains   LOCAL MEDICATIONS USED:  BUPIVICAINE   SPECIMEN:  Source of Specimen:  pleural fluid, pleura  DISPOSITION OF SPECIMEN:  PATHOLOGY  COUNTS:  YES  DICTATION: .Dragon Dictation  PLAN OF CARE: Admit to inpatient   PATIENT DISPOSITION:  PACU - hemodynamically stable.   Delay start of Pharmacological VTE agent (>24hrs) due to surgical blood loss or risk of bleeding: no  1.5 L serous fluid Inflammation of visceral and parietal pleura. Frozen- inflammation, no carcinoma Thin patchy fibrous peel on lung.

## 2019-10-03 NOTE — Op Note (Signed)
NAME: Denise Benitez, HALLUM MEDICAL RECORD [id number] ACCOUNT [id number] DATE OF BIRTH:07-Jan-1966 FACILITY: [facility] LOCATION: [facility] PHYSICIAN:Montrel Donahoe Chaya Jan, MD  OPERATIVE REPORT  DATE OF PROCEDURE:  10/03/2019  PREOPERATIVE DIAGNOSIS:  Recurrent right pleural effusion.  POSTOPERATIVE DIAGNOSIS:  Recurrent right pleural effusion.  PROCEDURE:   1.  Right video-assisted thoracoscopy. 2.  Drainage of pleural effusion. 3.  Pleural biopsies. 4.  Intercostal nerve blocks at levels 3 through 10.  SURGEON:  Modesto Charon, MD  ASSISTANT:  Nicholes Rough, PA-C  ANESTHESIA:  General.  FINDINGS:  1.5 liters of serous fluid evacuated.  Inflammatory appearing changes in the visceral and parietal pleura.  Parietal pleural biopsy.  Frozen section revealed inflammation.  No carcinoma was seen.  Areas of retraction and entrapment of the  lung, but no organized pleural peel other than 1 area along the minor fissure.  CLINICAL NOTE:  Denise Benitez is a 53 year old woman who presented with shortness of breath in August.  A chest x-ray showed a right pleural effusion.  Thoracentesis drained 1.5 L of fluid.  The fluid was exudative, but cytology was negative.  On  repeat chest x-ray, the fluid partially reaccumulated, but was persistent.  She was sent for evaluation for possible surgical drainage.  She was offered the option of repeat thoracentesis with additional steroids versus surgical drainage.  The  indications, risks, benefits and alternatives were discussed in detail with the patient.  She accepted the risks and wished to proceed with surgical drainage.  OPERATIVE NOTE:  Denise Benitez was brought to the preoperative holding area on 10/03/2019.  Anesthesia established intravenous access and placed an arterial blood pressure monitoring line.  She was taken to the operating room, anesthetized and intubated with  a double lumen endotracheal tube.  Intravenous antibiotics were administered.  A  Foley catheter was placed.  Sequential compression devices were placed on the calves for DVT prophylaxis.  She was placed in a left lateral decubitus position and the right  chest was prepped and draped in the usual sterile fashion.  Single-lung ventilation of the left lung was initiated and was tolerated well throughout the procedure.  A timeout was performed.  A solution containing 20 mL of liposomal bupivacaine, 30 mL of 0.5% bupivacaine and 50 mL of saline was prepared.  This was used for injection of the incision sites as well as for the intercostal nerve blocks.  An incision was  made in the 8th interspace in the midaxillary line.  The chest was entered bluntly using a hemostat.  A sucker was placed into the chest and 1.5 L of serous fluid was evacuated.  Fluid was sent for both cytology and cultures.  A 5 mm port was inserted  into the chest and the thoracoscope was advanced into the chest.  There were inflammatory changes of the visceral and parietal pleura.  A small working incision approximately 3-4 cm in length was made in the 5th interspace anterolaterally.  No rib  spreading was performed during the procedure.  The remainder of the pleural fluid was evacuated.  Inspection revealed nothing that appeared suspicious for cancer.  Pleural biopsies were obtained from 2 separate sites on the parietal pleura.  One site was  sent for frozen section, which returned showing inflammation, but no evidence of carcinoma.  There was a level 7 subcarinal node that was visible on inspection.  The pleura overlying this node was scored with electrocautery.  The node then was dissected  out and sent as a separate specimen for  permanent pathology.  Manual inflation of the lung revealed several sites where the lung was entrapped with a very thin layer of scar tissue.  For the most part, this was too thin to create a plane for  decortication.  There was an area along the minor fissure where the peel was thicker and as  the minor fissure was opened, a plane could be developed and the peel was taken off the upper and middle lobes anteriorly.  For the most part, a plane could not  be developed and cautery was used to score the pleura in a cross- hatched pattern to allow better expansion of the underlying lung.  The right lung was reinflated on multiple occasions to assess reexpansion.  There was not 100% reexpansion, but  reexpansion was greatly improved after completion of the procedure.  The chest was copiously irrigated with warm saline.  There were some small air leaks from the parenchyma where cautery was used.  A second port incision was made anterior to the first  and two 28-French Blake drains were placed through the port incisions.  They were both secured with #1 silk sutures.  Dual-lung ventilation was resumed.  The working incision was closed in standard fashion.  The chest tubes were placed to suction.  The  patient was placed back in supine position.  She was extubated in the operating room and taken to the postanesthetic care unit in good condition.  VN/NUANCE  D:10/03/2019 T:10/03/2019 JOB:[id number]

## 2019-10-03 NOTE — Interval H&P Note (Signed)
History and Physical Interval Note:  10/03/2019 1:20 PM  Denise Benitez  has presented today for surgery, with the diagnosis of RIGHT PLEURAL EFFUSION.  The various methods of treatment have been discussed with the patient and family. After consideration of risks, benefits and other options for treatment, the patient has consented to  Procedure(s): VIDEO ASSISTED THORACOSCOPY (Right) DRAINAGE OF PLEURAL EFFUSION (Right) DECORTICATION (Right) as a surgical intervention.  The patient's history has been reviewed, patient examined, no change in status, stable for surgery.  I have reviewed the patient's chart and labs.  Questions were answered to the patient's satisfaction.     Melrose Nakayama

## 2019-10-03 NOTE — Anesthesia Procedure Notes (Signed)
Arterial Line Insertion Start/End12/12/2018 10:54 AM, 10/03/2019 10:54 AM Performed by: Kathryne Hitch, CRNA, CRNA  Patient location: Pre-op. Preanesthetic checklist: patient identified, IV checked, site marked, risks and benefits discussed, surgical consent, monitors and equipment checked, pre-op evaluation, timeout performed and anesthesia consent Lidocaine 1% used for infiltration Right, radial was placed Catheter size: 20 Fr Hand hygiene performed  and maximum sterile barriers used   Attempts: 2 Procedure performed without using ultrasound guided technique. Following insertion, dressing applied and Biopatch. Post procedure assessment: normal and unchanged

## 2019-10-03 NOTE — Anesthesia Postprocedure Evaluation (Signed)
Anesthesia Post Note  Patient: TERE BAAR  Procedure(s) Performed: Right VIDEO ASSISTED THORACOSCOPY (Right Chest) DRAINAGE OF PLEURAL EFFUSION (Right ) DECORTICATION (Right )     Patient location during evaluation: PACU Anesthesia Type: General Level of consciousness: awake and alert Pain management: pain level controlled Vital Signs Assessment: post-procedure vital signs reviewed and stable Respiratory status: spontaneous breathing, nonlabored ventilation, respiratory function stable and patient connected to nasal cannula oxygen Cardiovascular status: blood pressure returned to baseline and stable Postop Assessment: no apparent nausea or vomiting Anesthetic complications: no    Last Vitals:  Vitals:   10/03/19 2015 10/03/19 2030  BP:    Pulse: 91 89  Resp: 14 10  Temp:    SpO2: 100% 100%    Last Pain:  Vitals:   10/03/19 2000  TempSrc:   PainSc: 7                  Catalina Gravel

## 2019-10-03 NOTE — Anesthesia Procedure Notes (Signed)
Procedure Name: Intubation Date/Time: 10/03/2019 2:01 PM Performed by: Colin Benton, CRNA Pre-anesthesia Checklist: Patient identified, Emergency Drugs available, Suction available and Patient being monitored Patient Re-evaluated:Patient Re-evaluated prior to induction Oxygen Delivery Method: Circle system utilized Preoxygenation: Pre-oxygenation with 100% oxygen Ventilation: Mask ventilation without difficulty Laryngoscope Size: Mac and 3 Grade View: Grade I Endobronchial tube: Left, Double lumen EBT and EBT position confirmed by fiberoptic bronchoscope and 35 Fr Number of attempts: 1 Airway Equipment and Method: Stylet Placement Confirmation: ETT inserted through vocal cords under direct vision,  positive ETCO2 and breath sounds checked- equal and bilateral Tube secured with: Tape Dental Injury: Teeth and Oropharynx as per pre-operative assessment

## 2019-10-03 NOTE — Anesthesia Preprocedure Evaluation (Signed)
Anesthesia Evaluation  Patient identified by MRN, date of birth, ID band Patient awake    Reviewed: Allergy & Precautions, NPO status , Patient's Chart, lab work & pertinent test results  Airway Mallampati: II  TM Distance: >3 FB Neck ROM: Full    Dental  (+) Teeth Intact, Dental Advisory Given   Pulmonary asthma , pneumonia, former smoker,  RIGHT PLEURAL EFFUSION   Pulmonary exam normal breath sounds clear to auscultation       Cardiovascular negative cardio ROS Normal cardiovascular exam Rhythm:Regular Rate:Normal     Neuro/Psych PSYCHIATRIC DISORDERS Anxiety negative neurological ROS     GI/Hepatic Neg liver ROS, GERD  ,  Endo/Other  Hypothyroidism   Renal/GU negative Renal ROS     Musculoskeletal negative musculoskeletal ROS (+)   Abdominal   Peds  (+) ATTENTION DEFICIT DISORDER WITHOUT HYPERACTIVITY Hematology negative hematology ROS (+)   Anesthesia Other Findings Day of surgery medications reviewed with the patient.  Reproductive/Obstetrics                             Anesthesia Physical Anesthesia Plan  ASA: II  Anesthesia Plan: General   Post-op Pain Management:    Induction: Intravenous  PONV Risk Score and Plan: 3 and Midazolam, Dexamethasone and Ondansetron  Airway Management Planned: Double Lumen EBT  Additional Equipment: Arterial line  Intra-op Plan:   Post-operative Plan: Extubation in OR  Informed Consent: I have reviewed the patients History and Physical, chart, labs and discussed the procedure including the risks, benefits and alternatives for the proposed anesthesia with the patient or authorized representative who has indicated his/her understanding and acceptance.     Dental advisory given  Plan Discussed with: CRNA  Anesthesia Plan Comments:         Anesthesia Quick Evaluation

## 2019-10-03 NOTE — Transfer of Care (Signed)
Immediate Anesthesia Transfer of Care Note  Patient: Denise Benitez  Procedure(s) Performed: Right VIDEO ASSISTED THORACOSCOPY (Right Chest) DRAINAGE OF PLEURAL EFFUSION (Right ) DECORTICATION (Right )  Patient Location: PACU  Anesthesia Type:General  Level of Consciousness: awake, alert , oriented and patient cooperative  Airway & Oxygen Therapy: Patient Spontanous Breathing and Patient connected to face mask oxygen  Post-op Assessment: Report given to RN, Post -op Vital signs reviewed and stable and Patient moving all extremities X 4  Post vital signs: Reviewed and stable  Last Vitals:  Vitals Value Taken Time  BP    Temp    Pulse    Resp    SpO2      Last Pain:  Vitals:   10/03/19 0953  TempSrc:   PainSc: 0-No pain         Complications: No apparent anesthesia complications

## 2019-10-04 ENCOUNTER — Inpatient Hospital Stay (HOSPITAL_COMMUNITY): Payer: BC Managed Care – PPO

## 2019-10-04 ENCOUNTER — Encounter (HOSPITAL_COMMUNITY): Payer: Self-pay | Admitting: Thoracic Surgery (Cardiothoracic Vascular Surgery)

## 2019-10-04 LAB — BASIC METABOLIC PANEL
Anion gap: 7 (ref 5–15)
BUN: 13 mg/dL (ref 6–20)
CO2: 23 mmol/L (ref 22–32)
Calcium: 8.7 mg/dL — ABNORMAL LOW (ref 8.9–10.3)
Chloride: 106 mmol/L (ref 98–111)
Creatinine, Ser: 0.84 mg/dL (ref 0.44–1.00)
GFR calc Af Amer: >60 mL/min (ref >60)
GFR calc non Af Amer: >60 mL/min (ref >60)
Glucose, Bld: 113 mg/dL — ABNORMAL HIGH (ref 70–99)
Potassium: 4.7 mmol/L (ref 3.5–5.1)
Sodium: 136 mmol/L (ref 135–145)

## 2019-10-04 LAB — CYTOLOGY - NON PAP

## 2019-10-04 LAB — CBC
HCT: 38.2 % (ref 36.0–46.0)
Hemoglobin: 11.9 g/dL — ABNORMAL LOW (ref 12.0–15.0)
MCH: 29 pg (ref 26.0–34.0)
MCHC: 31.2 g/dL (ref 30.0–36.0)
MCV: 93.2 fL (ref 80.0–100.0)
Platelets: 239 10*3/uL (ref 150–400)
RBC: 4.1 MIL/uL (ref 3.87–5.11)
RDW: 15 % (ref 11.5–15.5)
WBC: 8.3 10*3/uL (ref 4.0–10.5)
nRBC: 0 % (ref 0.0–0.2)

## 2019-10-04 LAB — POCT I-STAT 7, (LYTES, BLD GAS, ICA,H+H)
Acid-base deficit: 4 mmol/L — ABNORMAL HIGH (ref 0.0–2.0)
Bicarbonate: 22.3 mmol/L (ref 20.0–28.0)
Calcium, Ion: 1.27 mmol/L (ref 1.15–1.40)
HCT: 36 % (ref 36.0–46.0)
Hemoglobin: 12.2 g/dL (ref 12.0–15.0)
O2 Saturation: 97 %
Patient temperature: 98.2
Potassium: 4.6 mmol/L (ref 3.5–5.1)
Sodium: 136 mmol/L (ref 135–145)
TCO2: 24 mmol/L (ref 22–32)
pCO2 arterial: 43 mmHg (ref 32.0–48.0)
pH, Arterial: 7.322 — ABNORMAL LOW (ref 7.350–7.450)
pO2, Arterial: 99 mmHg (ref 83.0–108.0)

## 2019-10-04 LAB — PTT FACTOR INHIBITOR (MIXING STUDY)

## 2019-10-04 LAB — GLUCOSE, CAPILLARY
Glucose-Capillary: 112 mg/dL — ABNORMAL HIGH (ref 70–99)
Glucose-Capillary: 88 mg/dL (ref 70–99)

## 2019-10-04 MED ORDER — ENSURE ENLIVE PO LIQD
237.0000 mL | Freq: Two times a day (BID) | ORAL | Status: DC
  Administered 2019-10-04 – 2019-10-06 (×4): 237 mL via ORAL

## 2019-10-04 MED ORDER — INFLUENZA VAC SPLIT QUAD 0.5 ML IM SUSY: 0.5000 mL | PREFILLED_SYRINGE | INTRAMUSCULAR | Status: DC | PRN

## 2019-10-04 MED ORDER — DIPHENHYDRAMINE HCL 25 MG PO CAPS
50.0000 mg | ORAL_CAPSULE | Freq: Every evening | ORAL | Status: DC | PRN
  Administered 2019-10-06: 50 mg via ORAL
  Filled 2019-10-04 (×3): qty 2

## 2019-10-04 NOTE — Progress Notes (Signed)
 Initial Nutrition Assessment  DOCUMENTATION CODES:   Not applicable  INTERVENTION:   Ensure Enlive po BID, each supplement provides 350 kcal and 20 grams of protein   NUTRITION DIAGNOSIS:   Inadequate oral intake related to decreased appetite as evidenced by per patient/family report.  GOAL:   Patient will meet greater than or equal to 90% of their needs  MONITOR:   PO intake, Supplement acceptance, Labs, Weight trends  REASON FOR ASSESSMENT:   Malnutrition Screening Tool    ASSESSMENT:   53 yo spanish-speaking female admitted for surgery for right pleural effusion. PMH includes tobacco abuse, childhood asthma, pneumonia, HLD  RD working remotely.  12/03 thoracotomy, decortication, drainage of pleural effision  No recorded po intake  Per weight encounters, unclear if pt has lost any weight. Weight appears relatively stable. Current wt 63.5 kg  Chest tube x 2 with 600 mL   Labs: reviewed Meds: prednisone   Diet Order:   Diet Order            Diet Heart Room service appropriate? Yes; Fluid consistency: Thin  Diet effective now              EDUCATION NEEDS:   Not appropriate for education at this time  Skin:  Skin Assessment: Reviewed RN Assessment  Last BM:  12/03  Height:   Ht Readings from Last 1 Encounters:  10/03/19 5' 5.5" (1.664 m)    Weight:   Wt Readings from Last 1 Encounters:  10/03/19 63.5 kg    BMI:  Body mass index is 22.94 kg/m.  Estimated Nutritional Needs:   Kcal:  1800-2000 kcals  Protein:  85-100 g  Fluid:  >/= 1.8 L     Manoah Deckard MS, RDN, LDN, CNSC [phone] Pager  [phone] Weekend/On-Call Pager

## 2019-10-04 NOTE — Progress Notes (Signed)
1 Day Post-Op Procedure(s) (LRB): Right VIDEO ASSISTED THORACOSCOPY (Right) DRAINAGE OF PLEURAL EFFUSION (Right) DECORTICATION (Right) Subjective: Pain well controlled currently, denies nausea  Objective: Vital signs in last 24 hours: Temp:  [97.1 F (36.2 C)-98.2 F (36.8 C)] 97.8 F (36.6 C) (12/04 0814) Pulse Rate:  [68-114] 93 (12/04 0800) Cardiac Rhythm: Normal sinus rhythm (12/04 0400) Resp:  [7-20] 19 (12/04 0800) BP: (85-127)/(60-97) 92/63 (12/04 0800) SpO2:  [95 %-100 %] 95 % (12/04 0800) Arterial Line BP: (69-136)/(45-82) 69/65 (12/04 0800) Weight:  [63.5 kg] 63.5 kg (12/03 0921)  Hemodynamic parameters for last 24 hours:    Intake/Output from previous day: 12/03 0701 - 12/04 0700 In: 2622.6 [I.V.:2322.5; IV Piggyback:300.1] Out: 2830 [Urine:670; Blood:50; Chest Tube:610] Intake/Output this shift: Total I/O In: 100 [I.V.:100] Out: 45 [Urine:45]  General appearance: alert, cooperative and no distress Neurologic: intact Heart: tachy, regular Lungs: clear to auscultation bilaterally no air leak, serous drainage from CT  Lab Results: Recent Labs    10/01/19 1325 10/04/19 0347 10/04/19 0523  WBC 7.2 8.3  --   HGB 13.1 11.9* 12.2  HCT 40.7 38.2 36.0  PLT 263 239  --    BMET:  Recent Labs    10/01/19 1325 10/04/19 0347 10/04/19 0523  NA 135 136 136  K 4.3 4.7 4.6  CL 103 106  --   CO2 19* 23  --   GLUCOSE 92 113*  --   BUN 13 13  --   CREATININE 0.95 0.84  --   CALCIUM 9.5 8.7*  --     PT/INR:  Recent Labs    10/01/19 1325  LABPROT 13.6  INR 1.1   ABG    Component Value Date/Time   PHART 7.322 (L) 10/04/2019 0523   HCO3 22.3 10/04/2019 0523   TCO2 24 10/04/2019 0523   ACIDBASEDEF 4.0 (H) 10/04/2019 0523   O2SAT 97.0 10/04/2019 0523   CBG (last 3)  Recent Labs    10/03/19 2346 10/04/19 0411 10/04/19 0812  GLUCAP 125* 112* 88    Assessment/Plan: S/P Procedure(s) (LRB): Right VIDEO ASSISTED THORACOSCOPY (Right) DRAINAGE OF  PLEURAL EFFUSION (Right) DECORTICATION (Right) -POD # 1 Looks good this AM Pain controlled with PCA 610 out from CT overnight- will leave on suction today SCD + enoxaparin for DVT prophylaxis DC A line and Foley DC CBGs Ambulate Regular diet Transfer to [facility]   LOS: 1 day    Melrose Nakayama 10/04/2019

## 2019-10-05 ENCOUNTER — Inpatient Hospital Stay (HOSPITAL_COMMUNITY): Payer: BC Managed Care – PPO

## 2019-10-05 LAB — CBC
HCT: 34.9 % — ABNORMAL LOW (ref 36.0–46.0)
Hemoglobin: 11 g/dL — ABNORMAL LOW (ref 12.0–15.0)
MCH: 29.2 pg (ref 26.0–34.0)
MCHC: 31.5 g/dL (ref 30.0–36.0)
MCV: 92.6 fL (ref 80.0–100.0)
Platelets: 221 10*3/uL (ref 150–400)
RBC: 3.77 MIL/uL — ABNORMAL LOW (ref 3.87–5.11)
RDW: 14.9 % (ref 11.5–15.5)
WBC: 7.2 10*3/uL (ref 4.0–10.5)
nRBC: 0 % (ref 0.0–0.2)

## 2019-10-05 LAB — COMPREHENSIVE METABOLIC PANEL
ALT: 10 U/L (ref 0–44)
AST: 15 U/L (ref 15–41)
Albumin: 2.5 g/dL — ABNORMAL LOW (ref 3.5–5.0)
Alkaline Phosphatase: 75 U/L (ref 38–126)
Anion gap: 6 (ref 5–15)
BUN: 11 mg/dL (ref 6–20)
CO2: 24 mmol/L (ref 22–32)
Calcium: 8.7 mg/dL — ABNORMAL LOW (ref 8.9–10.3)
Chloride: 108 mmol/L (ref 98–111)
Creatinine, Ser: 0.76 mg/dL (ref 0.44–1.00)
GFR calc Af Amer: >60 mL/min (ref >60)
GFR calc non Af Amer: >60 mL/min (ref >60)
Glucose, Bld: 94 mg/dL (ref 70–99)
Potassium: 3.7 mmol/L (ref 3.5–5.1)
Sodium: 138 mmol/L (ref 135–145)
Total Bilirubin: 0.6 mg/dL (ref 0.3–1.2)
Total Protein: 5.7 g/dL — ABNORMAL LOW (ref 6.5–8.1)

## 2019-10-05 LAB — LUPUS ANTICOAGULANT PANEL
DRVVT: 49.8 s — ABNORMAL HIGH (ref 0.0–47.0)
PTT Lupus Anticoagulant: 60 s — ABNORMAL HIGH (ref 0.0–51.9)

## 2019-10-05 LAB — PTT-LA MIX: PTT-LA Mix: 59.8 s — ABNORMAL HIGH (ref 0.0–48.9)

## 2019-10-05 LAB — DRVVT MIX: dRVVT Mix: 43.2 s (ref 0.0–47.0)

## 2019-10-05 LAB — HEXAGONAL PHASE PHOSPHOLIPID: Hexagonal Phase Phospholipid: 17 s — ABNORMAL HIGH (ref 0–11)

## 2019-10-05 MED ORDER — ALPRAZOLAM 0.25 MG PO TABS
0.2500 mg | ORAL_TABLET | Freq: Two times a day (BID) | ORAL | Status: DC | PRN
  Administered 2019-10-05 – 2019-10-06 (×3): 0.25 mg via ORAL
  Filled 2019-10-05 (×3): qty 1

## 2019-10-05 MED FILL — Sodium Chloride IV Soln 0.9%: INTRAVENOUS | Qty: 50 | Status: AC

## 2019-10-05 MED FILL — Fentanyl Citrate Preservative Free (PF) Inj 1000 MCG/20ML: INTRAMUSCULAR | Qty: 20 | Status: AC

## 2019-10-05 NOTE — Progress Notes (Signed)
[address]       [place] [address]             [phone]      2 Days Post-Op Procedure(s) (LRB): Right VIDEO ASSISTED THORACOSCOPY (Right) DRAINAGE OF PLEURAL EFFUSION (Right) DECORTICATION (Right) Subjective: C/o "terrible pain"- some relief with PCA  Objective: Vital signs in last 24 hours: Temp:  [97.8 F (36.6 C)-98.8 F (37.1 C)] 98.5 F (36.9 C) (12/05 0706) Pulse Rate:  [87-114] 100 (12/05 0706) Cardiac Rhythm: Normal sinus rhythm (12/05 0834) Resp:  [14-26] 20 (12/05 0834) BP: (94-112)/(66-79) 103/69 (12/05 0706) SpO2:  [93 %-99 %] 99 % (12/05 0834)  Hemodynamic parameters for last 24 hours:    Intake/Output from previous day: 12/04 0701 - 12/05 0700 In: 451.9 [P.O.:200; I.V.:251.9] Out: 1465 [Urine:1115; Chest Tube:350] Intake/Output this shift: Total I/O In: -  Out: 50 [Chest Tube:50]  General appearance: alert, cooperative and no distress Heart: regular rate and rhythm Lungs: dim in right base Abdomen: benign Extremities: no edema or calf tenderness Wound: dressings intact  Lab Results: Recent Labs    10/04/19 0347 10/04/19 0523 10/05/19 0330  WBC 8.3  --  7.2  HGB 11.9* 12.2 11.0*  HCT 38.2 36.0 34.9*  PLT 239  --  221   BMET:  Recent Labs    10/04/19 0347 10/04/19 0523 10/05/19 0330  NA 136 136 138  K 4.7 4.6 3.7  CL 106  --  108  CO2 23  --  24  GLUCOSE 113*  --  94  BUN 13  --  11  CREATININE 0.84  --  0.76  CALCIUM 8.7*  --  8.7*    PT/INR: No results for input(s): LABPROT, INR in the last 72 hours. ABG    Component Value Date/Time   PHART 7.322 (L) 10/04/2019 0523   HCO3 22.3 10/04/2019 0523   TCO2 24 10/04/2019 0523   ACIDBASEDEF 4.0 (H) 10/04/2019 0523   O2SAT 97.0 10/04/2019 0523   CBG (last 3)  Recent Labs    10/03/19 2346 10/04/19 0411 10/04/19 0812  GLUCAP 125* 112* 88    Meds Scheduled Meds:  acetaminophen  1,000 mg Oral Q6H   Or   acetaminophen (TYLENOL) oral liquid  160 mg/5 mL  1,000 mg Oral Q6H   aspirin  81 mg Oral Daily   bisacodyl  10 mg Oral Daily   Chlorhexidine Gluconate Cloth  6 each Topical Daily   enoxaparin (LOVENOX) injection  40 mg Subcutaneous Daily   feeding supplement (ENSURE ENLIVE)  237 mL Oral BID BM   predniSONE  20 mg Oral Q breakfast   senna-docusate  1 tablet Oral QHS   Continuous Infusions:  sodium chloride     sodium chloride 10 mL/hr at 10/04/19 1400   PRN Meds:.[CANCELED] Place/Maintain arterial line **AND** sodium chloride, diphenhydrAMINE **OR** diphenhydrAMINE, diphenhydrAMINE, fentaNYL, influenza vac split quadrivalent PF, naloxone **AND** sodium chloride flush, ondansetron (ZOFRAN) IV, ondansetron (ZOFRAN) IV, traMADol  Xrays Dg Chest Port 1 View  Result Date: 10/04/2019 CLINICAL DATA:  Shortness of breath, chest tube EXAM: PORTABLE CHEST 1 VIEW COMPARISON:  October 03, 2019 FINDINGS: There are 2 apically directed chest tube from the right. No pneumothorax. Pleural fluid is present along the right chest wall. Opacity at the right lung base remains present. Stable cardiomediastinal contours. IMPRESSION: Similar positioning of chest tubes.  No pneumothorax. Residual pleural effusion tracking along the right chest wall. Opacity at the right lung base may  reflect an area of loculation or maybe parenchymal. Electronically Signed   By: Macy Mis M.D.   On: 10/04/2019 08:26   Dg Chest Port 1 View  Result Date: 10/03/2019 CLINICAL DATA:  Inpatient. Former smoker. Follow-up pleural effusion status post right chest tube placement. EXAM: PORTABLE CHEST 1 VIEW COMPARISON:  10/01/2019 chest radiograph. FINDINGS: Two right apical chest tubes are in place. Stable cardiomediastinal silhouette with normal heart size. No pneumothorax. Layering component of right pleural effusion has resolved. Ovoid opacity overlying the lower right lung. No left pleural effusion. No pulmonary edema. Improved aeration at the right lung base.  IMPRESSION: 1. Layering component of right pleural effusion has resolved status post placement of 2 right apical chest tubes. No pneumothorax. 2. Improved aeration at the right lung base. Nonspecific ovoid opacity overlying the lower right lung, favor small amount of trapped fluid within fissure. Chest radiograph first chest CT follow-up advised. Electronically Signed   By: Ilona Sorrel M.D.   On: 10/03/2019 17:25   Results for orders placed or performed during the hospital encounter of 10/03/19  SARS CORONAVIRUS 2 (TAT 6-24 HRS) Nasopharyngeal Nasopharyngeal Swab     Status: None   Collection Time: 10/01/19  9:21 AM   Specimen: Nasopharyngeal Swab  Result Value Ref Range Status   SARS Coronavirus 2 NEGATIVE NEGATIVE Final    Comment: (NOTE) SARS-CoV-2 target nucleic acids are NOT DETECTED. The SARS-CoV-2 RNA is generally detectable in upper and lower respiratory specimens during the acute phase of infection. Negative results do not preclude SARS-CoV-2 infection, do not rule out co-infections with other pathogens, and should not be used as the sole basis for treatment or other patient management decisions. Negative results must be combined with clinical observations, patient history, and epidemiological information. The expected result is Negative. Fact Sheet for Patients: SugarRoll.be Fact Sheet for Healthcare Providers: https://www.woods-mathews.com/ This test is not yet approved or cleared by the [place] FDA and  has been authorized for detection and/or diagnosis of SARS-CoV-2 by FDA under an Emergency Use Authorization (EUA). This EUA will remain  in effect (meaning this test can be used) for the duration of the COVID-19 declaration under Section 56 4(b)(1) of the Act, 21 U.S.C. section 360bbb-3(b)(1), unless the authorization is terminated or revoked sooner. Performed at [facility], [place] [address] [place], [place] [address]     Body fluid culture     Status: None (Preliminary result)   Collection Time: 10/03/19  2:45 PM   Specimen: PATH Cytology Pleural fluid; Body Fluid  Result Value Ref Range Status   Specimen Description FLUID RIGHT PLEURAL  Final   Special Requests PT ON ANCEF  Final   Gram Stain   Final    ABUNDANT WBC PRESENT,BOTH PMN AND MONONUCLEAR NO ORGANISMS SEEN    Culture   Final    NO GROWTH < 24 HOURS Performed at [facility], [address] [place], [place] [address]    Report Status PENDING  Incomplete  MRSA PCR Screening     Status: None   Collection Time: 10/03/19  7:17 PM   Specimen: Nasal Mucosa; Nasopharyngeal  Result Value Ref Range Status   MRSA by PCR NEGATIVE NEGATIVE Final    Comment:        The GeneXpert MRSA Assay (FDA approved for NASAL specimens only), is one component of a comprehensive MRSA colonization surveillance program. It is not intended to diagnose MRSA infection nor to guide or monitor treatment for MRSA infections.  Performed at [facility], [place] [address], [place], [place] [address]    Assessment/Plan: S/P Procedure(s) (LRB): Right VIDEO ASSISTED THORACOSCOPY (Right) DRAINAGE OF PLEURAL EFFUSION (Right) DECORTICATION (Right)  1 hemodyn stable in sinus rhythm, sats good on RA 2 conts with mod CT drainage- 350 cc yesterday, keep in place for now 3 CXR fairly stable, maybe some increase in asd/atx on right 4 no leukocytosis or fevers 5 minor anemia, H/H trending lower , cont to monitor clinically. H/H 11/24.9 6 renal fxn in normal range 7 CX neg so far, inflammatory changes- on oral prednisone 20 q day 8 DVT proph- lovenox 9 routine pulm toilet/rehab    LOS: 2 days    John Giovanni PA-C 10/05/2019 Pager [phone]- not for patient use

## 2019-10-05 NOTE — Progress Notes (Signed)
Talked patient's niece on the phone, she is anxious person. Patient also agreed to have some medications to calm her down. Notified PA [place] regarding this matter and ordered xanax 0.25 mg po. HS [employer]

## 2019-10-06 ENCOUNTER — Inpatient Hospital Stay (HOSPITAL_COMMUNITY): Payer: BC Managed Care – PPO

## 2019-10-06 LAB — BODY FLUID CULTURE: Culture: NO GROWTH

## 2019-10-06 MED ORDER — MAGNESIUM HYDROXIDE 400 MG/5ML PO SUSP
30.0000 mL | Freq: Every day | ORAL | Status: DC | PRN
  Administered 2019-10-06: 30 mL via ORAL
  Filled 2019-10-06: qty 30

## 2019-10-06 NOTE — Progress Notes (Signed)
[address]       [place] [address]             [phone]      3 Days Post-Op Procedure(s) (LRB): Right VIDEO ASSISTED THORACOSCOPY (Right) DRAINAGE OF PLEURAL EFFUSION (Right) DECORTICATION (Right) Subjective: Primary c/o is pain  Objective: Vital signs in last 24 hours: Temp:  [97.8 F (36.6 C)-99 F (37.2 C)] 99 F (37.2 C) (12/06 0734) Pulse Rate:  [88-99] 98 (12/06 0734) Cardiac Rhythm: Normal sinus rhythm (12/06 0701) Resp:  [10-29] 14 (12/06 0743) BP: (101-127)/(59-97) 105/89 (12/06 0734) SpO2:  [96 %-100 %] 99 % (12/06 0743)  Hemodynamic parameters for last 24 hours:    Intake/Output from previous day: 12/05 0701 - 12/06 0700 In: 660 [P.O.:660] Out: 2280 [Urine:2000; Chest Tube:280] Intake/Output this shift: No intake/output data recorded.  General appearance: alert, cooperative and no distress Heart: regular rate and rhythm Lungs: mildly dim in right base Abdomen: benign Extremities: no calf tenderness or edema Wound: incis healing well  Lab Results: Recent Labs    10/04/19 0347 10/04/19 0523 10/05/19 0330  WBC 8.3  --  7.2  HGB 11.9* 12.2 11.0*  HCT 38.2 36.0 34.9*  PLT 239  --  221   BMET:  Recent Labs    10/04/19 0347 10/04/19 0523 10/05/19 0330  NA 136 136 138  K 4.7 4.6 3.7  CL 106  --  108  CO2 23  --  24  GLUCOSE 113*  --  94  BUN 13  --  11  CREATININE 0.84  --  0.76  CALCIUM 8.7*  --  8.7*    PT/INR: No results for input(s): LABPROT, INR in the last 72 hours. ABG    Component Value Date/Time   PHART 7.322 (L) 10/04/2019 0523   HCO3 22.3 10/04/2019 0523   TCO2 24 10/04/2019 0523   ACIDBASEDEF 4.0 (H) 10/04/2019 0523   O2SAT 97.0 10/04/2019 0523   CBG (last 3)  Recent Labs    10/03/19 2346 10/04/19 0411 10/04/19 0812  GLUCAP 125* 112* 88    Meds Scheduled Meds: . acetaminophen  1,000 mg Oral Q6H   Or  . acetaminophen (TYLENOL) oral liquid 160 mg/5 mL  1,000 mg Oral Q6H  . aspirin   81 mg Oral Daily  . bisacodyl  10 mg Oral Daily  . Chlorhexidine Gluconate Cloth  6 each Topical Daily  . enoxaparin (LOVENOX) injection  40 mg Subcutaneous Daily  . feeding supplement (ENSURE ENLIVE)  237 mL Oral BID BM  . predniSONE  20 mg Oral Q breakfast  . senna-docusate  1 tablet Oral QHS   Continuous Infusions: . sodium chloride    . sodium chloride 10 mL/hr at 10/04/19 1400   PRN Meds:.[CANCELED] Place/Maintain arterial line **AND** sodium chloride, ALPRAZolam, diphenhydrAMINE **OR** diphenhydrAMINE, diphenhydrAMINE, fentaNYL, influenza vac split quadrivalent PF, naloxone **AND** sodium chloride flush, ondansetron (ZOFRAN) IV, ondansetron (ZOFRAN) IV, traMADol  Xrays Dg Chest Port 1 View  Result Date: 10/05/2019 CLINICAL DATA:  Evaluate pleural effusion. EXAM: PORTABLE CHEST 1 VIEW COMPARISON:  10/04/2019 FINDINGS: Right IJ catheter tip is at the cavoatrial junction. There are 2 right-sided chest tubes. No significant pneumothorax identified. Decreased right lung volume. Opacity in the right base and right mid lung are unchanged. IMPRESSION: 1. No significant change in aeration to the right lung compared with previous exam. 2. No significant pneumothorax identified after placement of 2 right-sided chest tubes. Electronically Signed   By: Lovena Le  Clovis Riley M.D.   On: 10/05/2019 11:05   Results for orders placed or performed during the hospital encounter of 10/03/19  SARS CORONAVIRUS 2 (TAT 6-24 HRS) Nasopharyngeal Nasopharyngeal Swab     Status: None   Collection Time: 10/01/19  9:21 AM   Specimen: Nasopharyngeal Swab  Result Value Ref Range Status   SARS Coronavirus 2 NEGATIVE NEGATIVE Final    Comment: (NOTE) SARS-CoV-2 target nucleic acids are NOT DETECTED. The SARS-CoV-2 RNA is generally detectable in upper and lower respiratory specimens during the acute phase of infection. Negative results do not preclude SARS-CoV-2 infection, do not rule out co-infections with other  pathogens, and should not be used as the sole basis for treatment or other patient management decisions. Negative results must be combined with clinical observations, patient history, and epidemiological information. The expected result is Negative. Fact Sheet for Patients: SugarRoll.be Fact Sheet for Healthcare Providers: https://www.woods-mathews.com/ This test is not yet approved or cleared by the [place] FDA and  has been authorized for detection and/or diagnosis of SARS-CoV-2 by FDA under an Emergency Use Authorization (EUA). This EUA will remain  in effect (meaning this test can be used) for the duration of the COVID-19 declaration under Section 56 4(b)(1) of the Act, 21 U.S.C. section 360bbb-3(b)(1), unless the authorization is terminated or revoked sooner. Performed at [facility], [place] [address] [place], [place] [address]   Body fluid culture     Status: None (Preliminary result)   Collection Time: 10/03/19  2:45 PM   Specimen: PATH Cytology Pleural fluid; Body Fluid  Result Value Ref Range Status   Specimen Description FLUID RIGHT PLEURAL  Final   Special Requests PT ON ANCEF  Final   Gram Stain   Final    ABUNDANT WBC PRESENT,BOTH PMN AND MONONUCLEAR NO ORGANISMS SEEN    Culture   Final    NO GROWTH 2 DAYS Performed at [facility], [address] [place], [place] [address]    Report Status PENDING  Incomplete  MRSA PCR Screening     Status: None   Collection Time: 10/03/19  7:17 PM   Specimen: Nasal Mucosa; Nasopharyngeal  Result Value Ref Range Status   MRSA by PCR NEGATIVE NEGATIVE Final    Comment:        The GeneXpert MRSA Assay (FDA approved for NASAL specimens only), is one component of a comprehensive MRSA colonization surveillance program. It is not intended to diagnose MRSA infection nor to guide or monitor treatment for MRSA infections. Performed at [facility], [place] [address] [place], [place] [address]    Assessment/Plan: S/P Procedure(s) (LRB): Right VIDEO ASSISTED THORACOSCOPY (Right) DRAINAGE OF PLEURAL EFFUSION (Right) DECORTICATION (Right)   1 conts to do well 2 hemodyn stable, sinus tachy at times- probably pain related 3 sats good on RA 4 CT- no air leak, CXR pending, 280 cc drainage yesterday, will d/c ant CT today and place to H2O seal 5 recheck labs in am 6 on po prednisone- will need to determine duration of RX 7 DVT prophy- Lovenox 8 leave PCA one more day with tube in place- told her to limit as able as is causing some constipation- add MOM 9 routine pulm toilet/rehab  LOS: 3 days    John Giovanni PA-C 10/06/2019 Pager [phone]- not for patient use

## 2019-10-06 NOTE — Progress Notes (Signed)
Anterior chest tube removed per order. Patient with increased anxiety. Medicated with xanax.

## 2019-10-07 ENCOUNTER — Inpatient Hospital Stay (HOSPITAL_COMMUNITY): Payer: BC Managed Care – PPO

## 2019-10-07 ENCOUNTER — Other Ambulatory Visit: Payer: Self-pay

## 2019-10-07 LAB — BASIC METABOLIC PANEL
Anion gap: 6 (ref 5–15)
BUN: 7 mg/dL (ref 6–20)
CO2: 30 mmol/L (ref 22–32)
Calcium: 9.2 mg/dL (ref 8.9–10.3)
Chloride: 105 mmol/L (ref 98–111)
Creatinine, Ser: 0.51 mg/dL (ref 0.44–1.00)
GFR calc Af Amer: >60 mL/min (ref >60)
GFR calc non Af Amer: >60 mL/min (ref >60)
Glucose, Bld: 93 mg/dL (ref 70–99)
Potassium: 3.8 mmol/L (ref 3.5–5.1)
Sodium: 141 mmol/L (ref 135–145)

## 2019-10-07 LAB — CBC
HCT: 34.7 % — ABNORMAL LOW (ref 36.0–46.0)
Hemoglobin: 10.9 g/dL — ABNORMAL LOW (ref 12.0–15.0)
MCH: 29.2 pg (ref 26.0–34.0)
MCHC: 31.4 g/dL (ref 30.0–36.0)
MCV: 93 fL (ref 80.0–100.0)
Platelets: 305 10*3/uL (ref 150–400)
RBC: 3.73 MIL/uL — ABNORMAL LOW (ref 3.87–5.11)
RDW: 14.6 % (ref 11.5–15.5)
WBC: 6.4 10*3/uL (ref 4.0–10.5)
nRBC: 0 % (ref 0.0–0.2)

## 2019-10-07 MED ORDER — SODIUM CHLORIDE 0.9% FLUSH: 10.0000 mL | Freq: Two times a day (BID) | INTRAVENOUS | Status: DC

## 2019-10-07 MED ORDER — TRAMADOL HCL 50 MG PO TABS: 50.0000 mg | ORAL_TABLET | Freq: Four times a day (QID) | ORAL | 0 refills | Status: DC | PRN

## 2019-10-07 MED ORDER — PREDNISONE 10 MG PO TABS: ORAL_TABLET | ORAL | 0 refills | Status: DC

## 2019-10-07 MED ORDER — ACETAMINOPHEN 325 MG PO TABS: 650.0000 mg | ORAL_TABLET | ORAL | 2 refills | Status: DC | PRN

## 2019-10-07 MED ORDER — SODIUM CHLORIDE 0.9% FLUSH: 10.0000 mL | INTRAVENOUS | Status: DC | PRN

## 2019-10-07 NOTE — Plan of Care (Signed)

## 2019-10-07 NOTE — Progress Notes (Signed)
Wasted 79mL of fentanyl in stericycle with Lucile Crater, RN

## 2019-10-07 NOTE — Discharge Instructions (Signed)

## 2019-10-07 NOTE — Progress Notes (Signed)
Removed chest tube, patient tolerated well, site is clean dry and intact. No drainage from site, 49mL from chest tube from day shift. Removed Right IJ, patient tolerated well, site clean dry and intact, no drainage. Held pressure for 5 minutes patient lying flat for 30 minutes. Patient is to be discharged today with husband.

## 2019-10-07 NOTE — Progress Notes (Addendum)
4 Days Post-Op Procedure(s) (LRB): Right VIDEO ASSISTED THORACOSCOPY (Right) DRAINAGE OF PLEURAL EFFUSION (Right) DECORTICATION (Right) Subjective: Feeling better, pain control adequate. No shortness of breath. On RA.  Objective: Vital signs in last 24 hours: Temp:  [97.8 F (36.6 C)-98.8 F (37.1 C)] 98.7 F (37.1 C) (12/07 0741) Pulse Rate:  [83-107] 98 (12/07 0353) Cardiac Rhythm: Normal sinus rhythm (12/07 0700) Resp:  [13-24] 23 (12/07 0741) BP: (106-136)/(72-92) 106/91 (12/07 0741) SpO2:  [97 %-98 %] 97 % (12/07 0418)     Intake/Output from previous day: 12/06 0701 - 12/07 0700 In: 835 [P.O.:720; I.V.:115] Out: 600 [Urine:500; Chest Tube:100] Intake/Output this shift: No intake/output data recorded.  General appearance: alert, cooperative and no distress Neurologic: intact Heart: regular rate and rhythm Lungs: breath sounds clear, CXR shows improved aeration in right base. Minimal CT drainage (16ml/24 hours), no air leak.  Wound: Well approximated with Dermabond covering incision.   Lab Results: Recent Labs    10/05/19 0330 10/07/19 0447  WBC 7.2 6.4  HGB 11.0* 10.9*  HCT 34.9* 34.7*  PLT 221 305   BMET:  Recent Labs    10/05/19 0330 10/07/19 0447  NA 138 141  K 3.7 3.8  CL 108 105  CO2 24 30  GLUCOSE 94 93  BUN 11 7  CREATININE 0.76 0.51  CALCIUM 8.7* 9.2    PT/INR: No results for input(s): LABPROT, INR in the last 72 hours. ABG    Component Value Date/Time   PHART 7.322 (L) 10/04/2019 0523   HCO3 22.3 10/04/2019 0523   TCO2 24 10/04/2019 0523   ACIDBASEDEF 4.0 (H) 10/04/2019 0523   O2SAT 97.0 10/04/2019 0523   CBG (last 3)  Recent Labs    10/04/19 0812  GLUCAP 88    Assessment/Plan: S/P Procedure(s) (LRB): Right VIDEO ASSISTED THORACOSCOPY (Right) DRAINAGE OF PLEURAL EFFUSION (Right) DECORTICATION (Right)  -POD4 drainage of pleural effusion with pleural BX for recurrent effusion. Cytology negative, tissue path pending.  Expect  we can remove CT later today. D/C PCA after CT removed. CXR in AM.   -Mobilize.   -Lovenox for DVT PPX.   LOS: 4 days    Antony Odea, PA-C [phone] 10/07/2019 Patient seen and examined, agree with above Dc chest tube Possibly home later today v AM  Remo Lipps C. Roxan Hockey, MD [facility] [phone]

## 2019-10-07 NOTE — Discharge Summary (Addendum)
Physician Discharge Summary  Patient ID: Denise Benitez MRN: [id number] DOB/AGE: 1966/08/23 53 y.o.  Admit date: 10/03/2019 Discharge date: 10/07/2019  Admission Diagnoses: Patient Active Problem List   Diagnosis Date Noted  . Pleural effusion 10/03/2019  . Heterozygous alpha 1-antitrypsin deficiency ([place]) 07/25/2019  . Pleural effusion on right 06/13/2019  . Attention deficit disorder 12/15/2016  . Rosacea 12/15/2016  . Thyroid activity decreased 03/03/2015  . Hyperlipidemia 03/03/2015  . Cyst of right Bartholin's gland 03/03/2015  . Perimenopause 02/27/2015  . Bartholin's duct cyst 02/27/2015    Discharge Diagnoses: Right pleural effusion with trapped lung  Patient Active Problem List   Diagnosis Date Noted  . Pleural effusion 10/03/2019  . Heterozygous alpha 1-antitrypsin deficiency ([place]) 07/25/2019  . Pleural effusion on right 06/13/2019  . Attention deficit disorder 12/15/2016  . Rosacea 12/15/2016  . Thyroid activity decreased 03/03/2015  . Hyperlipidemia 03/03/2015  . Cyst of right Bartholin's gland 03/03/2015  . Perimenopause 02/27/2015  . Bartholin's duct cyst 02/27/2015  s/p right VATS for drainage of pleural effusion and pleural biopsies  Discharged Condition: stable  History of Present Illness: Denise Benitez is a 53 year old woman with a history of moderate tobacco use, asthma, pneumonia, reflux, anxiety, hyperlipidemia, hypothyroidism, and attention deficit disorder.  She was found in August to have a right pleural effusion.  She had a thoracentesis which drained 1.5 L of fluid.  There was a small amount of residual fluid present.  Cytologies were negative.  The fluid was an exudate.  A repeat chest x-ray in September showed reaccumulation of the fluid.  It was still present on a chest x-ray a week ago.  Today's chest x-ray shows little if any change.  I reviewed the chest x-ray images with Denise Benitez and her niece.  We discussed the differential diagnosis.   This is most likely a postinfectious exudative effusion.  Malignancy or other inflammatory causes are also possible.  We discussed 2 potential options.  The first would be a repeat thoracentesis and then see if the fluid reaccumulates while on prednisone.  The second, more aggressive option, would be to proceed with right VATS for drainage of the effusion and decortication.  I suspect given that this effusion has been present for 3 months now that she will end up needing a VATS, but it is not unreasonable to try another thoracentesis first.  I discussed the proposed surgical procedure with them.  That would be a right VATS for drainage of pleural effusion and decortication.  I informed them of the need for general anesthesia, the incisions to be used, the use of drainage tubes postoperatively, the expected hospital stay, and the overall recovery.  I informed them of the indications, risks, benefits, and alternatives.  They understand the risks include, but not limited to death, MI, DVT, PE, bleeding, possible need for transfusion, infection, prolonged air leak, cardiac arrhythmias, as well as the possibility of other unforeseeable complications.  I do think she is relatively low risk with the exception of bleeding and air leak.  She wishes to have some time to think over her options and discuss with her family.  I think that is fine.  There is no rush although we do need to deal with this eventually.  I also emphasized that there is really no downside to trying another thoracentesis, although I am not optimistic that that would completely solve the problem.  Hospital Course: Ms. Denise Benitez decided to proceed with surgery. She was admitted to the hospital for same-day  surgery on 12/4/202020 and after usual preoperative preparation was taken to the operating room. Right video-assisted thoracoscopy was performed for drainage of the recurrent pleural effusion. Pleural biopsies were also obtained and frozen  section obtained intraoperatively showed no evidence of malignancy. An intercostal nerve block was performed. She tolerated procedure well. Following surgery, she was recovered in the postanesthesia care unit and later transferred to [facility] progressive care. Respiratory status remained stable. She had a small air leak that eventually subsided. Chest tube was placed to waterseal and showed no significant pneumothorax by the following day. Chest tube was removed on 10/07/2019 and follow-up chest x-ray obtained several hours later again showed no evidence of pneumothorax and no significant reaccumulation of pleural effusion. Pain was managed with PCA pump postoperatively.   At the time of discharge, she was tolerating a regular diet with no difficulty and was ambulating independently. She is having appropriate bowel bladder function. Cytology studies on the pleural fluid showed no evidence of malignancy.  She was given instructions for prednisone taper over 6 days beginning on the first day following discharge.  Consults: None  Significant Diagnostic Studies:   EXAM: CHEST - 1 VIEW SAME DAY  COMPARISON:  Same day.  FINDINGS: Stable cardiomediastinal silhouette. Left lung is clear. Stable position of right internal jugular catheter. Right-sided chest tube has been removed. Minimal right apical pneumothorax is noted which is not significantly changed compared to prior exam stable right basilar atelectasis or infiltrate is noted. Bony thorax is unremarkable.  IMPRESSION: Status post removal of right-sided chest tube. Minimal right apical pneumothorax is noted. Stable right basilar opacity as described above.   Electronically Signed   By: Marijo Conception M.D.   On: 10/07/2019 14:56   Treatments:   OPERATIVE REPORT  DATE OF PROCEDURE:  10/03/2019  PREOPERATIVE DIAGNOSIS:  Recurrent right pleural effusion.  POSTOPERATIVE DIAGNOSIS:  Recurrent right pleural effusion.  PROCEDURE:    1.  Right video-assisted thoracoscopy. 2.  Drainage of pleural effusion. 3.  Pleural biopsies. 4.  Intercostal nerve blocks at levels 3 through 10.  SURGEON:  Modesto Charon, MD  ASSISTANT:  Nicholes Rough, PA-C  ANESTHESIA:  General.  FINDINGS:  1.5 liters of serous fluid evacuated.  Inflammatory appearing changes in the visceral and parietal pleura.  Parietal pleural biopsy.  Frozen section revealed inflammation.  No carcinoma was seen.  Areas of retraction and entrapment of the  lung, but no organized pleural peel other than 1 area along the minor fissure.   Discharge Exam: Blood pressure 99/72, pulse 98, temperature 98.6 F (37 C), temperature source Oral, resp. rate 17, height 5' 5.5" (1.664 m), weight 63.5 kg, last menstrual period 12/01/2016, SpO2 97 %.  Physical Exam: General appearance: alert, cooperative and no distress Neurologic: intact Heart: regular rate and rhythm Lungs: breath sounds clear, CXR shows improved aeration in right base. Minimal CT drainage (190ml/24 hours), no air leak.  Wound: Well approximated with Dermabond covering incision  Disposition:    Allergies as of 10/07/2019      Reactions   Codeine Itching      Medication List    TAKE these medications   acetaminophen 325 MG tablet Commonly known as: Tylenol Take 2 tablets (650 mg total) by mouth every 4 (four) hours as needed.   aspirin 81 MG tablet Take 81 mg by mouth daily.   Azelaic Acid 15 % cream After skin is thoroughly washed and patted dry, gently but thoroughly massage a thin film of azelaic acid cream  into the affected area twice daily, in the morning and evening.   Biotin 1000 MCG tablet Take 1,000 mcg by mouth daily.   cholecalciferol 25 MCG (1000 UT) tablet Commonly known as: VITAMIN D3 Take 1,000 Units by mouth daily.   diphenhydrAMINE 25 MG tablet Commonly known as: BENADRYL Take 50 mg by mouth at bedtime as needed for sleep.   escitalopram 10 MG  tablet Commonly known as: LEXAPRO Take 1 tablet (10 mg total) by mouth daily.   Fish Oil 1200 MG Caps Take 1,200 mg by mouth daily.   fluticasone 50 MCG/ACT nasal spray Commonly known as: FLONASE Place 2 sprays into both nostrils daily as needed for allergies or rhinitis.   predniSONE 10 MG tablet Commonly known as: DELTASONE Take  1 tablet (10mg ) each am for 3 days then  Take 1/2 tablet  (5mg ) each morning for 3 days then Stop taking this medication. What changed: additional instructions   traMADol 50 MG tablet Commonly known as: ULTRAM Take 1 tablet (50 mg total) by mouth every 6 (six) hours as needed (mild pain).   vitamin C 500 MG tablet Commonly known as: ASCORBIC ACID Take 500 mg by mouth daily.      Follow-up Information    Melrose Nakayama, MD. Go on 10/22/2019.   Specialty: Cardiothoracic Surgery Why: You have an appoitment with Dr. Roxan Hockey on Tuesday, 12.22.20 at 11:00am. Please arrive 30 minutes early for a chest x-ray to be performed by [facility] located on the first floor of the same building.  Contact information: [address] [place] [address] [phone]           Signed: Antony Odea, PA-C  10/07/2019, 3:14 PM

## 2019-10-08 LAB — SURGICAL PATHOLOGY

## 2019-10-15 ENCOUNTER — Other Ambulatory Visit: Payer: Self-pay

## 2019-10-16 ENCOUNTER — Encounter: Payer: Self-pay | Admitting: Internal Medicine

## 2019-10-16 ENCOUNTER — Other Ambulatory Visit: Payer: Self-pay | Admitting: Thoracic Surgery (Cardiothoracic Vascular Surgery)

## 2019-10-16 ENCOUNTER — Ambulatory Visit: Payer: BC Managed Care – PPO | Admitting: Internal Medicine

## 2019-10-16 VITALS — BP 110/60 | HR 112 | Temp 97.0°F | Resp 18 | Ht 65.5 in | Wt 140.0 lb

## 2019-10-16 DIAGNOSIS — Z09 Encounter for follow-up examination after completed treatment for conditions other than malignant neoplasm: Secondary | ICD-10-CM | POA: Insufficient documentation

## 2019-10-16 DIAGNOSIS — F32A Depression, unspecified: Secondary | ICD-10-CM | POA: Insufficient documentation

## 2019-10-16 DIAGNOSIS — F419 Anxiety disorder, unspecified: Secondary | ICD-10-CM | POA: Insufficient documentation

## 2019-10-16 DIAGNOSIS — G4700 Insomnia, unspecified: Secondary | ICD-10-CM | POA: Insufficient documentation

## 2019-10-16 DIAGNOSIS — F329 Major depressive disorder, single episode, unspecified: Secondary | ICD-10-CM

## 2019-10-16 DIAGNOSIS — G4709 Other insomnia: Secondary | ICD-10-CM | POA: Diagnosis not present

## 2019-10-16 DIAGNOSIS — Z23 Encounter for immunization: Secondary | ICD-10-CM

## 2019-10-16 DIAGNOSIS — J90 Pleural effusion, not elsewhere classified: Secondary | ICD-10-CM | POA: Diagnosis not present

## 2019-10-16 MED ORDER — ALPRAZOLAM 0.5 MG PO TABS: 0.2500 mg | ORAL_TABLET | Freq: Every evening | ORAL | 1 refills | Status: DC | PRN

## 2019-10-16 NOTE — Patient Instructions (Addendum)
  GO TO THE FRONT DESK Schedule your next appointment   for a checkup in 6 weeks  Take melatonin every night  Take alprazolam: Either half or 1 tablet at bedtime if unable to sleep  HEALTHY SLEEP Sleep hygiene: Basic rules for a good night's sleep  Sleep only as much as you need to feel rested and then get out of bed  Keep a regular sleep schedule  Avoid forcing sleep  Exercise regularly for at least 20 minutes, preferably 4 to 5 hours before bedtime  Avoid caffeinated beverages after lunch  Avoid alcohol near bedtime: no "night cap"  Avoid smoking, especially in the evening  Do not go to bed hungry  Adjust bedroom environment  Avoid prolonged use of light-emitting screens before bedtime   Deal with your worries before bedtime

## 2019-10-16 NOTE — Assessment & Plan Note (Signed)
Pleural effusion: Managed by pulmonary and thoracic surgery.  Has an appointment to see thoracic surgery next week Smoker: She quit tobacco ready Anxiety depression insomnia: Since last visit took Lexapro for 2 months, then self stopped because she felt it was affecting her memory.  Since then she reports she is doing better emotionally but he still has insomnia. She is slightly anxious appearing on exam. For insomnia I recommend to continue melatonin, stop Benadryl (see next), Xanax as needed, good habits discussed MCI?  She seems to be forgetful but not demented.  Will do more formal mental exam when she comes back.  Will stop Benadryl for now.  She also drinks apparently several beers during the weekend, strongly recommend moderation. Weight loss: Stable since last visit Anemia: We will proceed with work-up when she comes back Preventive care: Flu shot today, PNM 23 not available, will provide her next opportunity RTC 6 weeks

## 2019-10-16 NOTE — Progress Notes (Signed)
Subjective:    Patient ID: Denise Benitez, female    DOB: 1966-08-16, 53 y.o.   MRN: [id number]  DOS:  10/16/2019 Type of visit - description: Follow-up  Since the last office visit, she found to have a right pleural effusion, has been evaluated by pulmonaryand thoracic surgery.  Last procedure was a right video-assisted thoracoscopy, drainage of pleural fluid and biopsies.  So far, no malignancy has been identified  Anxiety depression: Took Lexapro temporarily, stopped it because she felt it was affecting her memory.  She also wonders if drinking EtOH during the weekends affect the memory.  Insomnia: On Benadryl.  Tobacco abuse: She quit already  Anemia noted  Concerned about her memory, she is forgetful but no major problems like getting lost or not knowing where she is or what time is  Wt Readings from Last 3 Encounters:  10/16/19 140 lb (63.5 kg)  10/03/19 140 lb (63.5 kg)  10/01/19 146 lb 9 oz (66.5 kg)    Review of Systems Denies nausea, vomiting, diarrhea.  No blood in the stools No cough Chest pain: Mild, from surgery, on Tylenol No vaginal bleeding.  Past Medical History:  Diagnosis Date  . ADD (attention deficit disorder)   . Anxiety   . Asthma    as a child, "no longer have it"  . Bartholin gland cyst   . Colon polyp    adenomatous  . Diverticulosis   . Elevated LFTs   . GERD (gastroesophageal reflux disease)   . Hemorrhoids   . Hyperlipidemia   . Hypothyroidism    patient denies  . Perimenopause   . Pneumonia   . Seasonal allergies   . Varicose veins     Past Surgical History:  Procedure Laterality Date  . CHOLECYSTECTOMY  1995  . DECORTICATION Right 10/03/2019   Procedure: DECORTICATION;  Surgeon: Melrose Nakayama, MD;  Location: [facility];  Service: Thoracic;  Laterality: Right;  . OVARIAN CYST REMOVAL Right   . PELVIC LAPAROSCOPY     ovarian cystectomy  . PLEURAL EFFUSION DRAINAGE Right 10/03/2019   Procedure: DRAINAGE OF PLEURAL  EFFUSION;  Surgeon: Melrose Nakayama, MD;  Location: [facility];  Service: Thoracic;  Laterality: Right;  . [place]  . VIDEO ASSISTED THORACOSCOPY Right 10/03/2019   Procedure: Right VIDEO ASSISTED THORACOSCOPY;  Surgeon: Melrose Nakayama, MD;  Location: [facility] OR;  Service: Thoracic;  Laterality: Right;    Social History   Socioeconomic History  . Marital status: Married    Spouse name: Not on file  . Number of children: 2  . Years of education: Not on file  . Highest education level: Not on file  Occupational History  . Occupation: house wife  Tobacco Use  . Smoking status: Former Smoker    Packs/day: 0.50    Years: 40.00    Pack years: 20.00    Types: Cigarettes    Quit date: 05/2019    Years since quitting: 0.4  . Smokeless tobacco: Never Used  Substance and Sexual Activity  . Alcohol use: Yes    Alcohol/week: 0.0 standard drinks    Comment: weekends 10  . Drug use: No  . Sexual activity: Yes    Partners: Male    Comment: 1st intercourse- 74, partners- 50, married- 25 yrs   Other Topics Concern  . Not on file  Social History Narrative   Original from [place], moved to [place]: pt, husband, younger son  Social Determinants of Health   Financial Resource Strain:   . Difficulty of Paying Living Expenses: Not on file  Food Insecurity:   . Worried About Charity fundraiser in the Last Year: Not on file  . Ran Out of Food in the Last Year: Not on file  Transportation Needs:   . Lack of Transportation (Medical): Not on file  . Lack of Transportation (Non-Medical): Not on file  Physical Activity:   . Days of Exercise per Week: Not on file  . Minutes of Exercise per Session: Not on file  Stress:   . Feeling of Stress : Not on file  Social Connections:   . Frequency of Communication with Friends and Family: Not on file  . Frequency of Social Gatherings with Friends and Family: Not on file  . Attends Religious Services: Not on file  .  Active Member of Clubs or Organizations: Not on file  . Attends Archivist Meetings: Not on file  . Marital Status: Not on file  Intimate Partner Violence:   . Fear of Current or Ex-Partner: Not on file  . Emotionally Abused: Not on file  . Physically Abused: Not on file  . Sexually Abused: Not on file      Allergies as of 10/16/2019      Reactions   Codeine Itching      Medication List       Accurate as of October 16, 2019 10:09 AM. If you have any questions, ask your nurse or doctor.        acetaminophen 325 MG tablet Commonly known as: Tylenol Take 2 tablets (650 mg total) by mouth every 4 (four) hours as needed.   aspirin 81 MG tablet Take 81 mg by mouth daily.   Azelaic Acid 15 % cream After skin is thoroughly washed and patted dry, gently but thoroughly massage a thin film of azelaic acid cream into the affected area twice daily, in the morning and evening.   Biotin 1000 MCG tablet Take 1,000 mcg by mouth daily.   cholecalciferol 25 MCG (1000 UT) tablet Commonly known as: VITAMIN D3 Take 1,000 Units by mouth daily.   diphenhydrAMINE 25 MG tablet Commonly known as: BENADRYL Take 50 mg by mouth at bedtime as needed for sleep.   escitalopram 10 MG tablet Commonly known as: LEXAPRO Take 1 tablet (10 mg total) by mouth daily.   Fish Oil 1200 MG Caps Take 1,200 mg by mouth daily.   fluticasone 50 MCG/ACT nasal spray Commonly known as: FLONASE Place 2 sprays into both nostrils daily as needed for allergies or rhinitis.   predniSONE 10 MG tablet Commonly known as: DELTASONE Take  1 tablet (10mg ) each am for 3 days then  Take 1/2 tablet  (5mg ) each morning for 3 days then Stop taking this medication.   traMADol 50 MG tablet Commonly known as: ULTRAM Take 1 tablet (50 mg total) by mouth every 6 (six) hours as needed (mild pain).   vitamin C 500 MG tablet Commonly known as: ASCORBIC ACID Take 500 mg by mouth daily.           Objective:     Physical Exam BP 110/60 (BP Location: Right Arm, Patient Position: Sitting, Cuff Size: Normal)   Pulse (!) 112   Temp (!) 97 F (36.1 C) (Temporal)   Resp 18   Ht 5' 5.5" (1.664 m)   Wt 140 lb (63.5 kg)   LMP 12/01/2016   SpO2 96%  BMI 22.94 kg/m  General:   Well developed, NAD, BMI noted. HEENT:  Normocephalic . Face symmetric, atraumatic Lungs:  Slightly decreased breath sounds at the right base otherwise clear Normal respiratory effort, no intercostal retractions, no accessory muscle use. Heart: RRR,  no murmur.  No pretibial edema bilaterally  Skin: Not pale. Not jaundice Neurologic:  alert & oriented X3.  Speech normal, gait appropriate for age and unassisted Psych--  Cognition and judgment appear intact.  Cooperative with normal attention span and concentration.  Behavior appropriate. Slightly anxious but not  depressed appearing.      Assessment    ASSESSMENT  (new patient 06-2019, referred by Mr. Blaine Hamper) History of Raynaud phenomena, on aspirin Hyperlipidemia Smoker Thyroid disease History of ADD  PLAN: Pleural effusion: Managed by pulmonary and thoracic surgery.  Has an appointment to see thoracic surgery next week Smoker: She quit tobacco ready Anxiety depression insomnia: Since last visit took Lexapro for 2 months, then self stopped because she felt it was affecting her memory.  Since then she reports she is doing better emotionally but he still has insomnia. She is slightly anxious appearing on exam. For insomnia I recommend to continue melatonin, stop Benadryl (see next), Xanax as needed, good habits discussed MCI?  She seems to be forgetful but not demented.  Will do more formal mental exam when she comes back.  Will stop Benadryl for now.  She also drinks apparently several beers during the weekend, strongly recommend moderation. Weight loss: Stable since last visit Anemia: We will proceed with work-up when she comes back Preventive care: Flu shot  today, PNM 23 not available, will provide her next opportunity RTC 6 weeks   This visit occurred during the SARS-CoV-2 public health emergency.  Safety protocols were in place, including screening questions prior to the visit, additional usage of staff PPE, and extensive cleaning of exam room while observing appropriate contact time as indicated for disinfecting solutions.

## 2019-10-22 ENCOUNTER — Ambulatory Visit: Payer: Self-pay | Admitting: Thoracic Surgery (Cardiothoracic Vascular Surgery)

## 2019-10-22 ENCOUNTER — Telehealth: Payer: Self-pay

## 2019-10-22 DIAGNOSIS — G3184 Mild cognitive impairment, so stated: Secondary | ICD-10-CM

## 2019-10-22 NOTE — Telephone Encounter (Signed)
Please advise 

## 2019-10-22 NOTE — Telephone Encounter (Signed)
Copied from [place] [id number]. Topic: Referral - Request for Referral >> Oct 22, 2019  3:10 PM Virl Axe D wrote: Has patient seen PCP for this complaint? No, pt forgot to mention at her last appt *If NO, is insurance requiring patient see PCP for this issue before PCP can refer them? Referral for which specialty: Neurology Preferred provider/office: In network Reason for referral: Short term and long term memory/alcohol related?

## 2019-10-23 ENCOUNTER — Ambulatory Visit (INDEPENDENT_AMBULATORY_CARE_PROVIDER_SITE_OTHER): Payer: Self-pay | Admitting: Thoracic Surgery (Cardiothoracic Vascular Surgery)

## 2019-10-23 ENCOUNTER — Other Ambulatory Visit: Payer: Self-pay

## 2019-10-23 ENCOUNTER — Ambulatory Visit (HOSPITAL_COMMUNITY)
Admission: RE | Admit: 2019-10-23 | Discharge: 2019-10-23 | Disposition: A | Discharge status: Home / Self Care | Payer: BC Managed Care – PPO | Source: Ambulatory Visit | Attending: Thoracic Surgery (Cardiothoracic Vascular Surgery) | Admitting: Thoracic Surgery (Cardiothoracic Vascular Surgery)

## 2019-10-23 VITALS — BP 126/77 | HR 108 | Temp 97.8°F | Resp 20 | Ht 65.5 in | Wt 140.0 lb

## 2019-10-23 DIAGNOSIS — J90 Pleural effusion, not elsewhere classified: Secondary | ICD-10-CM

## 2019-10-23 DIAGNOSIS — Z09 Encounter for follow-up examination after completed treatment for conditions other than malignant neoplasm: Secondary | ICD-10-CM

## 2019-10-23 NOTE — Telephone Encounter (Signed)
Referral placed.

## 2019-10-23 NOTE — Progress Notes (Signed)
[address]       [place] [address]             [phone]       HPI: Denise Benitez returns for a scheduled follow-up visit  Denise Benitez is a 53 year old woman with a history of tobacco abuse, asthma, pneumonia, reflux, hyperlipidemia and hypothyroidism.  She presented with dyspnea with exertion.  A chest x-ray showed a right pleural effusion.  Thoracentesis drained 1.5 L of fluid.  Cytology was negative and the fluid was an exudate.  On follow-up chest x-ray the fluid partially reaccumulated and she was referred for surgical drainage.  I did a right VATS for drainage of the pleural effusion and pleural biopsies on 10/03/2019.  Pathology was all negative for malignancy.  It did show chronic inflammation.  Her postoperative course was uncomplicated and she went home on day 4.  She feels well.  She is not taking any narcotics.  She does take Tylenol occasionally.  She notices some soreness around the incision when she takes a deep breath or coughs.  Overall her respiratory status is improved compared to her preoperative status.  Past Medical History:  Diagnosis Date  . ADD (attention deficit disorder)   . Anxiety   . Asthma    as a child, "no longer have it"  . Bartholin gland cyst   . Colon polyp    adenomatous  . Diverticulosis   . Elevated LFTs   . GERD (gastroesophageal reflux disease)   . Hemorrhoids   . Hyperlipidemia   . Hypothyroidism    patient denies  . Perimenopause   . Pneumonia   . Seasonal allergies   . Varicose veins      Current Outpatient Medications  Medication Sig Dispense Refill  . acetaminophen (TYLENOL) 325 MG tablet Take 2 tablets (650 mg total) by mouth every 4 (four) hours as needed. 100 tablet 2  . ALPRAZolam (XANAX) 0.5 MG tablet Take 0.5-1 tablets (0.25-0.5 mg total) by mouth at bedtime as needed for sleep. 30 tablet 1  . aspirin 81 MG tablet Take 81 mg by mouth daily.    . Azelaic Acid 15 % cream After skin is  thoroughly washed and patted dry, gently but thoroughly massage a thin film of azelaic acid cream into the affected area twice daily, in the morning and evening. 30 g 0  . Biotin 1000 MCG tablet Take 1,000 mcg by mouth daily.    . cholecalciferol (VITAMIN D3) 25 MCG (1000 UT) tablet Take 1,000 Units by mouth daily.    . fluticasone (FLONASE) 50 MCG/ACT nasal spray Place 2 sprays into both nostrils daily as needed for allergies or rhinitis.    . Omega-3 Fatty Acids (FISH OIL) 1200 MG CAPS Take 1,200 mg by mouth daily.     . vitamin C (ASCORBIC ACID) 500 MG tablet Take 500 mg by mouth daily.      No current facility-administered medications for this visit.    Physical Exam BP 126/77   Pulse (!) 108   Temp 97.8 F (36.6 C) (Skin)   Resp 20   Ht 5' 5.5" (1.664 m)   Wt 140 lb (63.5 kg)   LMP 12/01/2016   SpO2 96%   BMI 22.62 kg/m  53 year old woman in no acute distress Alert and oriented x3 with no focal deficits Lungs clear with equal breath sounds bilaterally Cardiac regular rate and rhythm Incisions well-healed  Diagnostic Tests: CHEST - 2 VIEW  COMPARISON:  10/07/2019.  FINDINGS: Interval removal of right IJ line. Mediastinum and hilar structures normal. Heart size normal. Right base infiltrate/atelectasis again noted. Slight improvement in aeration from prior exam. Small right pleural effusion. Interval resolution of previously identified small right pneumothorax. Calcified pulmonary nodule on the left again noted and consistent granuloma. Heart size stable. Thoracic spine scoliosis. Surgical clips right upper quadrant.  IMPRESSION: 1. Right base atelectasis/infiltrate again noted. Slight improvement in aeration from prior exam. Small right pleural effusion.  2. Interval resolution of previously identified small right pneumothorax.  3.  Interval removal of right IJ line.   Electronically Signed   By: Marcello Moores  Register   On: 10/23/2019 15:18 I personally  reviewed the chest x-ray images and concur with the findings noted above  Impression: Denise Benitez is a 53 year old woman who presented with dyspnea on exertion and was found to have a right pleural effusion.  She initially had a 1.5 L of fluid drained, but had partial reaccumulation.  I did right VATS for drainage of the effusion and pleural biopsies on 10/03/2019.  Cytology and pathology were both negative for malignancy.  She did well postoperatively and went home on day 4.  She continues to do well.  She is having some mild discomfort, but is not requiring any narcotics.  There are no restrictions on her activities at this point but she was advised to build into new activities gradually.  She may begin driving.  Appropriate precautions were discussed.  She will follow-up with Dr. Larose Kells.  No further follow-up necessary for the pleural effusion. Plan: I will be happy to see Denise Benitez back anytime in the future if I can be of any further assistance with her care  Melrose Nakayama, MD [facility] [phone]

## 2019-10-23 NOTE — Telephone Encounter (Signed)
Okay to arrange a neurology referral, DX MCI, mild cognitive impairment

## 2019-11-09 ENCOUNTER — Other Ambulatory Visit: Payer: Self-pay

## 2019-11-09 DIAGNOSIS — Z20822 Contact with and (suspected) exposure to covid-19: Secondary | ICD-10-CM

## 2019-11-10 LAB — NOVEL CORONAVIRUS, NAA: SARS-CoV-2, NAA: NOT DETECTED

## 2019-11-27 ENCOUNTER — Ambulatory Visit: Payer: BC Managed Care – PPO | Admitting: Internal Medicine

## 2019-11-27 DIAGNOSIS — Z0289 Encounter for other administrative examinations: Secondary | ICD-10-CM

## 2019-12-04 ENCOUNTER — Encounter: Payer: Self-pay | Admitting: Internal Medicine

## 2019-12-04 ENCOUNTER — Ambulatory Visit: Payer: BC Managed Care – PPO | Admitting: Internal Medicine

## 2019-12-04 ENCOUNTER — Other Ambulatory Visit: Payer: Self-pay

## 2019-12-04 VITALS — BP 100/61 | HR 98 | Temp 97.2°F | Resp 18 | Ht 65.5 in | Wt 150.0 lb

## 2019-12-04 DIAGNOSIS — Z79899 Other long term (current) drug therapy: Secondary | ICD-10-CM

## 2019-12-04 DIAGNOSIS — F329 Major depressive disorder, single episode, unspecified: Secondary | ICD-10-CM

## 2019-12-04 DIAGNOSIS — F419 Anxiety disorder, unspecified: Secondary | ICD-10-CM

## 2019-12-04 DIAGNOSIS — J90 Pleural effusion, not elsewhere classified: Secondary | ICD-10-CM

## 2019-12-04 DIAGNOSIS — F32A Depression, unspecified: Secondary | ICD-10-CM

## 2019-12-04 DIAGNOSIS — D649 Anemia, unspecified: Secondary | ICD-10-CM

## 2019-12-04 NOTE — Patient Instructions (Addendum)
GO TO THE LAB : Get the blood work     Kila back for a physical exam in 3 months, fasting, please make an appointment    We are committed to keeping you informed about the COVID-19 vaccine.  As the vaccine continues to become available for each phase, we will ensure that patients who meet the criteria receive the information they need to access vaccination opportunities. Continue to check your MyChart account and [email or web address] for updates. Please review the Phase 1b information below.  [place] On Tuesday, Jan. 19, the [facility]) and [place] began large-scale COVID-19 vaccinations at the [facility]. The vaccinations are appointment only and for those 2 and older.  Walk-ins will not be accepted.  All appointments are currently filled. Please join our waiting list for the next available appointments. We will contact you when appointments become available. Please do not sign up more than once.  Join Our Waiting List  Our daily vaccination capacity will continue to increase, including expansion of our [employer] site, increasing mobile clinics across our service region and plans to open COVID-19 vaccination clinics in [place] and [place] counties in February.  On Friday, Jan. 22, we announced the need to reschedule 10,400 vaccination appointments because of the state's decision not to allocate [facility] with COVID-19 vaccines for the week of Jan. 25. Our announcement of this news is available here.  Going forward, we will schedule vaccination appointments weekly based on vaccine allocations provided by the state.  On Friday, Jan. 29, the [place] announced that [employer] and [place] will again be receiving vaccines. Our announcement of this news is  available here.  We will first schedule vaccination appointments for those whose appointments were cancelled last week, then schedule those on the [place] waiting list in the order in which they registered. Those who had their appointment cancelled should expect notification by email (or by phone for those without email) at least 48 hours prior to their rescheduled appointment.  Other Vaccination Opportunities in [place] We are also working in partnership with county health agencies in our service counties to ensure continuing vaccination availability in the weeks and months ahead. Learn more about each county's vaccination efforts in the website links below:   [place]'s phase 1b vaccination guidelines, prioritizing those 65 and over as the next eligible group to receive the COVID-19 vaccine, are detailed at [email or web address].   Vaccine Safety and Effectiveness Clinical trials for the Pfizer COVID-19 vaccine involved 42,000 people and showed that the vaccine is more than 95% effective in preventing COVID-19 with no serious safety concerns. Similar results have been reported for the Moderna COVID-19 vaccine. Side effects reported in the [place] clinical trials include a sore arm at the injection site, fatigue, headache, chills and fever. While side effects from the [place] COVID-19 vaccine are higher than for a typical flu vaccine, they are lower in many ways than side effects from the leading vaccine to prevent shingles. Side effects are signs that a vaccine is working and are related to your immune system being stimulated to produce antibodies against infection. Side effects from vaccination are far less significant than health impacts from COVID-19.  Staying Informed Pharmacists, infectious disease doctors, critical care nurses and other  experts at [facility] continue to speak publicly through media interviews and direct  communication with our patients and communities about the safety, effectiveness and importance of vaccines to eliminate COVID-19. In addition, reliable information on vaccine safety, effectiveness, side effects and more is available on the following websites:  [place] Department of Health and Human Services COVID-19 Vaccine Information Website.  U.S. Centers for Disease Control and Prevention [id number] Human resources officer.  Staying Safe We agree with the CDC on what we can do to help our communities get back to normal: Getting "back to normal" is going to take all of our tools. If we use all the tools we have, we stand the best chance of getting our families, communities, schools and workplaces "back to normal" sooner:  Get vaccinated as soon as vaccines become available within the phase of the state's vaccination rollout plan for which you meet the eligibility criteria.  Wear a mask.  Stay 6 feet from others and avoid crowds.  Wash hands often.  For our most current information, please visit [email or web address].

## 2019-12-04 NOTE — Progress Notes (Signed)
Pre visit review using our clinic review tool, if applicable. No additional management support is needed unless otherwise documented below in the visit note. 

## 2019-12-04 NOTE — Progress Notes (Addendum)
Subjective:    Patient ID: Denise Benitez, female    DOB: 04-30-1966, 54 y.o.   MRN: [id number]  DOS:  12/04/2019 Type of visit - description: Follow-up Pleural effusion: Note from surgery reviewed. Still slightly short of breath. MCI?  Memory is improving, still describe herself as forgetful Anxiety, depression, insomnia: Doing better, on Xanax Anemia noted on previous labs, see review of systems   Wt Readings from Last 3 Encounters:  12/04/19 150 lb (68 kg)  10/23/19 140 lb (63.5 kg)  10/16/19 140 lb (63.5 kg)    Review of Systems Denies fever chills No nausea, vomiting, diarrhea. She is menopausal, no vaginal bleeding.  Past Medical History:  Diagnosis Date  . ADD (attention deficit disorder)   . Anxiety   . Asthma    as a child, "no longer have it"  . Bartholin gland cyst   . Colon polyp    adenomatous  . Diverticulosis   . Elevated LFTs   . GERD (gastroesophageal reflux disease)   . Hemorrhoids   . Hyperlipidemia   . Hypothyroidism    patient denies  . Perimenopause   . Pneumonia   . Seasonal allergies   . Varicose veins     Past Surgical History:  Procedure Laterality Date  . CHOLECYSTECTOMY  1995  . DECORTICATION Right 10/03/2019   Procedure: DECORTICATION;  Surgeon: Melrose Nakayama, MD;  Location: [place];  Service: Thoracic;  Laterality: Right;  . OVARIAN CYST REMOVAL Right   . PELVIC LAPAROSCOPY     ovarian cystectomy  . PLEURAL EFFUSION DRAINAGE Right 10/03/2019   Procedure: DRAINAGE OF PLEURAL EFFUSION;  Surgeon: Melrose Nakayama, MD;  Location: [facility] [facility];  Service: Thoracic;  Laterality: Right;  . [place]  . VIDEO ASSISTED THORACOSCOPY Right 10/03/2019   Procedure: Right VIDEO ASSISTED THORACOSCOPY;  Surgeon: Melrose Nakayama, MD;  Location: [facility];  Service: Thoracic;  Laterality: Right;        Objective:   Physical Exam BP 100/61 (BP Location: Left Arm, Patient Position: Sitting, Cuff Size: Small)   Pulse 98   Temp  (!) 97.2 F (36.2 C) (Temporal)   Resp 18   Ht 5' 5.5" (1.664 m)   Wt 150 lb (68 kg)   LMP 12/01/2016   SpO2 98%   BMI 24.58 kg/m  General:   Well developed, NAD, BMI noted.  HEENT:  Normocephalic . Face symmetric, atraumatic Lungs:  Slightly decreased breath sounds at the lower right field Normal respiratory effort, no intercostal retractions, no accessory muscle use. Heart: RRR,  no murmur.  no pretibial edema bilaterally  Abdomen:  Not distended, soft, non-tender. No rebound or rigidity.   Skin: Not pale. Not jaundice Neurologic:  alert & oriented X3.  Speech normal, gait appropriate for age and unassisted Psych--  Cognition and judgment appear intact.  Cooperative with normal attention span and concentration.  Behavior appropriate. No anxious or depressed appearing.     Assessment     ASSESSMENT  (new patient 06-2019, referred by Mr. Blaine Hamper) History of Raynaud phenomena, on aspirin Hyperlipidemia R pleural effusion, VATS, Bx (-) for malignancy Smoker quit  Thyroid disease History of ADD  PLAN: Pleural effusion: Last visit with surgery 10/23/2019, they did a VATS, cytology and pathology negative for malignancy, they recommended to RTC as needed.  Since the visit, shortness of breath is slightly decreased but is still there.  Minimal chest pain.  She is able to do all her ADLs. Plan  is to monitor clinically, x-ray on RTC, if symptoms start to return or resurface she will let me know. MCI?  See last visit, today she feels better, still has some mild forgetfulness but nothing serious. Weight loss: Resolved Anxiety depression insomnia: Better, only on Xanax as needed, feeling well. UDS and contract today Anemia: Review of systems negative, recheck BMP, CBC, reticulocyte count, iron ferritin TIBC. Preventive care: We will hold off the [facility] vaccination as I hope she can get the Covid vaccine at some point soon. RTC CPX 3 months    This visit occurred during the  SARS-CoV-2 public health emergency.  Safety protocols were in place, including screening questions prior to the visit, additional usage of staff PPE, and extensive cleaning of exam room while observing appropriate contact time as indicated for disinfecting solutions.

## 2019-12-05 LAB — BASIC METABOLIC PANEL
BUN: 20 mg/dL (ref 6–23)
CO2: 29 mEq/L (ref 19–32)
Calcium: 9.7 mg/dL (ref 8.4–10.5)
Chloride: 103 mEq/L (ref 96–112)
Creatinine, Ser: 0.96 mg/dL (ref 0.40–1.20)
GFR: 60.1 mL/min (ref >60.00)
Glucose, Bld: 82 mg/dL (ref 70–99)
Potassium: 4.1 mEq/L (ref 3.5–5.1)
Sodium: 140 mEq/L (ref 135–145)

## 2019-12-05 LAB — CBC WITH DIFFERENTIAL/PLATELET
Basophils Absolute: 0.1 10*3/uL (ref 0.0–0.1)
Basophils Relative: 1 % (ref 0.0–3.0)
Eosinophils Absolute: 1.8 10*3/uL — ABNORMAL HIGH (ref 0.0–0.7)
Eosinophils Relative: 23.1 % — ABNORMAL HIGH (ref 0.0–5.0)
HCT: 36.5 % (ref 36.0–46.0)
Hemoglobin: 11.9 g/dL — ABNORMAL LOW (ref 12.0–15.0)
Lymphocytes Relative: 18.5 % (ref 12.0–46.0)
Lymphs Abs: 1.5 10*3/uL (ref 0.7–4.0)
MCHC: 32.7 g/dL (ref 30.0–36.0)
MCV: 91.6 fl (ref 78.0–100.0)
Monocytes Absolute: 0.6 10*3/uL (ref 0.1–1.0)
Monocytes Relative: 8 % (ref 3.0–12.0)
Neutro Abs: 3.9 10*3/uL (ref 1.4–7.7)
Neutrophils Relative %: 49.4 % (ref 43.0–77.0)
Platelets: 321 10*3/uL (ref 150.0–400.0)
RBC: 3.98 Mil/uL (ref 3.87–5.11)
RDW: 14.9 % (ref 11.5–15.5)
WBC: 8 10*3/uL (ref 4.0–10.5)

## 2019-12-05 LAB — IRON, TOTAL/TOTAL IRON BINDING CAP
%SAT: 15 % (calc) — ABNORMAL LOW (ref 16–45)
Iron: 54 ug/dL (ref 45–160)
TIBC: 360 mcg/dL (calc) (ref 250–450)

## 2019-12-05 LAB — FERRITIN: Ferritin: 108.4 ng/mL (ref 10.0–291.0)

## 2019-12-05 LAB — RETICULOCYTES
ABS Retic: 74860 cells/uL (ref 20000–8000)
Retic Ct Pct: 1.9 %

## 2019-12-05 LAB — IRON: Iron: 59 ug/dL (ref 42–145)

## 2019-12-05 NOTE — Assessment & Plan Note (Signed)
Pleural effusion: Last visit with surgery 10/23/2019, they did a VATS, cytology and pathology negative for malignancy, they recommended to RTC as needed.  Since the visit, shortness of breath is slightly decreased but is still there.  Minimal chest pain.  She is able to do all her ADLs. Plan is to monitor clinically, x-ray on RTC, if symptoms start to return or resurface she will let me know. MCI?  See last visit, today she feels better, still has some mild forgetfulness but nothing serious. Weight loss: Resolved Anxiety depression insomnia: Better, only on Xanax as needed, feeling well. UDS and contract today Anemia: Review of systems negative, recheck BMP, CBC, reticulocyte count, iron ferritin TIBC. Preventive care: We will hold off the [facility] vaccination as I hope she can get the Covid vaccine at some point soon. RTC CPX 3 months

## 2019-12-06 LAB — PAIN MGMT, PROFILE 8 W/CONF, U
6 Acetylmorphine: NEGATIVE ng/mL
Alcohol Metabolites: POSITIVE ng/mL — AB (ref <500)
Amphetamines: NEGATIVE ng/mL
Benzodiazepines: NEGATIVE ng/mL
Buprenorphine, Urine: NEGATIVE ng/mL
Cocaine Metabolite: NEGATIVE ng/mL
Creatinine: 85.4 mg/dL
Ethyl Glucuronide (ETG): 37891 ng/mL
Ethyl Sulfate (ETS): 10215 ng/mL
MDMA: NEGATIVE ng/mL
Marijuana Metabolite: NEGATIVE ng/mL
Opiates: NEGATIVE ng/mL
Oxidant: NEGATIVE ug/mL
Oxycodone: NEGATIVE ng/mL
pH: 5.7 (ref 4.5–9.0)

## 2019-12-17 ENCOUNTER — Ambulatory Visit: Payer: Self-pay | Admitting: Neurology

## 2020-01-22 ENCOUNTER — Encounter: Payer: Self-pay | Admitting: Neurology

## 2020-01-22 ENCOUNTER — Ambulatory Visit: Payer: BC Managed Care – PPO | Admitting: Neurology

## 2020-01-22 ENCOUNTER — Telehealth: Payer: Self-pay | Admitting: Internal Medicine

## 2020-01-22 ENCOUNTER — Other Ambulatory Visit: Payer: Self-pay

## 2020-01-22 VITALS — BP 122/78 | HR 92 | Temp 97.4°F | Ht 65.75 in | Wt 145.0 lb

## 2020-01-22 DIAGNOSIS — E519 Thiamine deficiency, unspecified: Secondary | ICD-10-CM

## 2020-01-22 DIAGNOSIS — R413 Other amnesia: Secondary | ICD-10-CM

## 2020-01-22 DIAGNOSIS — E8801 Alpha-1-antitrypsin deficiency: Secondary | ICD-10-CM

## 2020-01-22 DIAGNOSIS — F04 Amnestic disorder due to known physiological condition: Secondary | ICD-10-CM

## 2020-01-22 MED ORDER — B COMPLEX VITAMINS PO CAPS: 1.0000 | ORAL_CAPSULE | Freq: Three times a day (TID) | ORAL | 0 refills | Status: AC

## 2020-01-22 NOTE — Progress Notes (Signed)
Provider:  Larey Seat, M D  Referring Provider: Colon Branch, MD Primary Care Physician:  Colon Branch, MD  Chief Complaint  Patient presents with  . New Patient (Initial Visit)    pt alone, able to understand and speak english. she was started on lexapro for depression, she has been forgeful since starting the med, so she stopped it. she states since being off the medication since dec last yr and still has memory concerns.    HPI:  Denise Benitez is a 54 y.o. female Patient of [place] descent and seen here on 01-22-2020 after a referralfrom Dr. Larose Kells for memory loss.  Mrs Sharber was diagnosed with depression/ anxious- and Dr.Paz prescribed Lexapro. She continued to consume alcohol at the same time.   She began having depression symptoms when her marriage was in crisis , 2 years earlier. She as very isolated during the pandemic. She got the first Covid shot and expects the second later this week.  She denies having a problem with alcohol at first, then indicated that she drinks beer on weekends, and a six pack - she has a strong family history of alcoholism. She has black outs- she cannot remember an appointment, she is not driving.    She cannot remember places by name , not how to get there and not when she was last there.   Lexapro was d/c in December and yet she is still having some memory problems.  She is taking vitamins, calcium, D , fish oil, ASA,  But no Vitmine B/ thiamin. She has lost her sense of temperature in her fingertips, and has raynauds.  Balance rouble  and Tremor are newly reported.   History lists dx of :   Review of Systems: Out of a complete 14 system review, the patient complains of only the following symptoms, and all other reviewed systems are negative.  severely affected Short term memory- newly-   Social History   Socioeconomic History  . Marital status: Married    Spouse name: Not on file  . Number of children: 2  . Years of education:  Not on file  . Highest education level: Not on file  Occupational History  . Occupation: house wife  Tobacco Use  . Smoking status: Former Smoker    Packs/day: 0.50    Years: 40.00    Pack years: 20.00    Types: Cigarettes    Quit date: 05/2019    Years since quitting: 0.7  . Smokeless tobacco: Never Used  Substance and Sexual Activity  . Alcohol use: Yes    Alcohol/week: 0.0 standard drinks    Comment: weekends 10  . Drug use: No  . Sexual activity: Yes    Partners: Male    Comment: 1st intercourse- 24, partners- 57, married- 64 yrs   Other Topics Concern  . Not on file  Social History Narrative   Original from [place], moved to [place]: pt, husband, younger son   Social Determinants of Radio broadcast assistant Strain:   . Difficulty of Paying Living Expenses:   Food Insecurity:   . Worried About Charity fundraiser in the Last Year:   . Arboriculturist in the Last Year:   Transportation Needs:   . Film/video editor (Medical):   Marland Kitchen Lack of Transportation (Non-Medical):   Physical Activity:   . Days of Exercise per Week:   . Minutes of Exercise per  Session:   Stress:   . Feeling of Stress :   Social Connections:   . Frequency of Communication with Friends and Family:   . Frequency of Social Gatherings with Friends and Family:   . Attends Religious Services:   . Active Member of Clubs or Organizations:   . Attends Archivist Meetings:   Marland Kitchen Marital Status:   Intimate Partner Violence:   . Fear of Current or Ex-Partner:   . Emotionally Abused:   Marland Kitchen Physically Abused:   . Sexually Abused:     Family History  Problem Relation Age of Onset  . Hypertension Father   . Liver disease Father   . Hypertension Paternal Aunt   . Stomach cancer Paternal Uncle        mets  . Hypertension Paternal Uncle   . Breast cancer Paternal Grandmother   . Hypertension Paternal Grandmother   . Colon polyps Paternal Aunt   . Colon cancer Neg Hx      Past Medical History:  Diagnosis Date  . ADD (attention deficit disorder)   . Anxiety   . Asthma    as a child, "no longer have it"  . Bartholin gland cyst   . Colon polyp    adenomatous  . Diverticulosis   . Elevated LFTs   . GERD (gastroesophageal reflux disease)   . Hemorrhoids   . Hyperlipidemia   . Hypothyroidism    patient denies  . Perimenopause   . Pneumonia   . Seasonal allergies   . Varicose veins     Past Surgical History:  Procedure Laterality Date  . CHOLECYSTECTOMY  1995  . DECORTICATION Right 10/03/2019   Procedure: DECORTICATION;  Surgeon: Melrose Nakayama, MD;  Location: [place];  Service: Thoracic;  Laterality: Right;  . OVARIAN CYST REMOVAL Right   . PELVIC LAPAROSCOPY     ovarian cystectomy  . PLEURAL EFFUSION DRAINAGE Right 10/03/2019   Procedure: DRAINAGE OF PLEURAL EFFUSION;  Surgeon: Melrose Nakayama, MD;  Location: [place];  Service: Thoracic;  Laterality: Right;  . [place]  . VIDEO ASSISTED THORACOSCOPY Right 10/03/2019   Procedure: Right VIDEO ASSISTED THORACOSCOPY;  Surgeon: Melrose Nakayama, MD;  Location: [facility] OR;  Service: Thoracic;  Laterality: Right;    Current Outpatient Medications  Medication Sig Dispense Refill  . ALPRAZolam (XANAX) 0.5 MG tablet Take 0.5-1 tablets (0.25-0.5 mg total) by mouth at bedtime as needed for sleep. 30 tablet 1  . aspirin 81 MG tablet Take 81 mg by mouth daily.    . Biotin 1000 MCG tablet Take 1,000 mcg by mouth daily.    . Calcium Carbonate (CALCIUM 600 PO) Take 1 tablet by mouth daily.    . cholecalciferol (VITAMIN D3) 25 MCG (1000 UT) tablet Take 1,000 Units by mouth daily.    . Omega-3 Fatty Acids (FISH OIL) 1200 MG CAPS Take 1,200 mg by mouth daily.     . vitamin C (ASCORBIC ACID) 500 MG tablet Take 500 mg by mouth daily.      No current facility-administered medications for this visit.    Allergies as of 01/22/2020 - Review Complete 01/22/2020  Allergen Reaction Noted  .  Codeine Itching 02/27/2015    Vitals: BP 122/78   Pulse 92   Temp (!) 97.4 F (36.3 C)   Ht 5' 5.75" (1.67 m)   Wt 145 lb (65.8 kg)   LMP 12/01/2016   BMI 23.58 kg/m  Last Weight:  Wt Readings from  Last 1 Encounters:  01/22/20 145 lb (65.8 kg)   Last Height:   Ht Readings from Last 1 Encounters:  01/22/20 5' 5.75" (1.67 m)    Physical exam:  General: The patient is awake, alert and appears not in acute distress. The patient is well groomed. Head: Normocephalic, atraumatic. Neck is supple. Mallampati 2, neck circumference:14 Cardiovascular:  Regular rate and rhythm , without  murmurs or carotid bruit, and without distended neck veins. Respiratory: Lungs are clear to auscultation. Skin:  Facial erythema, spider navi-rash Trunk: BMI is 23.5 patient has normal posture.  Neurologic exam : The patient is awake and alert, oriented to place and time.  Memory subjective  described as impaired .> Montreal Cognitive Assessment  01/22/2020  Visuospatial/ Executive (0/5) 2  Naming (0/3) 3  Attention: Read list of digits (0/2) 1  Attention: Read list of letters (0/1) 1  Attention: Serial 7 subtraction starting at 100 (0/3) 1  Language: Repeat phrase (0/2) 0  Abstraction (0/2) 2  Orientation (0/6) 6    There is no  normal attention span & concentration ability the patient appears fidgety . MMSE - Mini Mental State Exam 01/22/2020  Orientation to time 5  Orientation to Place 5  Registration 3  Attention/ Calculation 5  Recall 1  Language- name 2 objects 2  Language- repeat 0  Language- repeat-comments i think more due to language barrier  Language- follow 3 step command 3  Language- read & follow direction 1  Write a sentence 1  Copy design 1  Total score 27      Speech is fluent without  dysarthria, dysphonia or aphasia. Mood and affect are tearful Cranial nerves: Pupils are equal and briskly reactive to light. Funduscopic exam without  evidence of pallor or edema.  Extraocular movements  in vertical and horizontal planes intact and without nystagmus.  Visual fields by finger perimetry are intact. Hearing to finger rub intact.  Facial sensation intact to fine touch. Facial motor strength is symmetric and tongue and uvula move midline. Tongue protrusion into either cheek is normal. The soft [alate is dark red, inflamed (GERD , laryngo-pharyngeal reflux) . Shoulder shrug is normal.   Motor exam:   Normal tone ,muscle bulk and symmetric  strength in all extremities.  Sensory:  Fine touch, pinprick and vibration were normal in  all extremities.  Proprioception was normal.  Coordination: Rapid alternating movements in the fingers/hands were normal. Finger-to-nose maneuver  normal without evidence of ataxia, dysmetria or tremor.  Gait and station: Patient walks without assistive device and is able unassisted to climb up to the exam table. Strength within normal limits. Stance is stable and normal.  Tandem gait is unfragmented.  Romberg testing is negative   Deep tendon reflexes: in the upper and lower extremities are symmetric and intact. Babinski maneuver response is  downgoing.   Assessment:  After physical and neurologic examination, review of laboratory studies, imaging, neurophysiology testing and pre-existing records, assessment is that of :   Highly anxious , depressed appearing 54 year old female with a history of high volume alcohol use.   Binge drinker- needs support to quit drinking   Memory loss of short term memory- evident in [facility] and in MMSE- there is significant impairment, not all language barrier related.   Plan:  Treatment plan and additional workup :  Referral- MRI brain with and without contrast. Thiamine supplementation- B complex po tid, for 30 days than once a day following that. High protein diet, fresh  vegetables, fresh fruits.  Trazodone for expected sleep problems in alcohol withdrawal.  Consider [facility] - for  residential stay.  In any case- AA is the right place for her.       Asencion Partridge Juventino Pavone MD 01/22/2020

## 2020-01-22 NOTE — Telephone Encounter (Signed)
Note from neurology reviewed, he has problems with EtOH. Please arrange a visit at her earliest convenience, in person.

## 2020-01-22 NOTE — Patient Instructions (Signed)
Alcohol Abuse and Nutrition Alcohol abuse is any pattern of alcohol consumption that harms your health, relationships, or work. Alcohol abuse can cause poor nutrition (malnutrition or malnourishment) and a lack of nutrients (nutrient deficiencies), which can lead to more complications. Alcohol abuse brings malnutrition and nutrient deficiencies in two ways:  It causes your liver to work abnormally. This affects how your body divides (breaks down) and absorbs nutrients from food.  It causes you to eat poorly. Many people who abuse alcohol do not eat enough carbohydrates, protein, fat, vitamins, and minerals. Nutrients that are commonly lacking (deficient) in people who abuse alcohol include:  Vitamins. ? Vitamin A. This is needed for your vision, metabolism, and ability to fight off infections (immunity). ? B vitamins. These include folate, thiamine, and niacin. These are needed for new cell growth. ? Vitamin C. This plays an important role in wound healing, immunity, and helping your body to absorb iron. ? Vitamin D. This is necessary for your body to absorb and use calcium. It is produced by your liver, but you can also get it from food and from sun exposure.  Minerals. ? Calcium. This is needed for healthy bones as well as heart and blood vessel (cardiovascular) function. ? Iron. This is important for blood, muscle, and nervous system functioning. ? Magnesium. This plays an important role in muscle and nerve function, and it helps to control blood sugar and blood pressure. ? Zinc. This is important for the normal functioning of your nervous system and digestive system (gastrointestinal tract). If you think that you have an alcohol dependency problem, or if it is hard to stop drinking because you feel sick or different when you do not use alcohol, talk with your health care provider or another health professional about where to get help. Nutrition is an essential factor in therapy for alcohol  abuse. Your health care provider or diet and nutrition specialist (dietitian) will work with you to design a plan that can help to restore nutrients to your body and prevent the risk of complications. What is my plan? Your dietitian may develop a specific eating plan that is based on your condition and any other problems that you have. An eating plan will commonly include:  A balanced diet. ? Grains: 6-8 oz (170-227 g) a day. Examples of 1 oz of whole grains include 1 cup of whole-wheat cereal,  cup of brown rice, or 1 slice of whole-wheat bread. ? Vegetables: 2-3 cups a day. Examples of 1 cup of vegetables include 2 medium carrots, 1 large tomato, or 2 stalks of celery. ? Fruits: 1-2 cups a day. Examples of 1 cup of fruit include 1 large banana, 1 small apple, 8 large strawberries, or 1 large orange. ? Meat and other protein: 5-6 oz (142-170 g) a day.  A cut of meat or fish that is the size of a deck of cards is about 3-4 oz.  Foods that provide 1 oz of protein include 1 egg,  cup of nuts or seeds, or 1 tablespoon (16 g) of peanut butter. ? Dairy: 2-3 cups a day. Examples of 1 cup of dairy include 8 oz (230 mL) of milk, 8 oz (230 g) of yogurt, or 1 oz (44 g) of natural cheese.  Vitamin and mineral supplements. What are tips for following this plan?  Eat frequent meals and snacks. Try to eat 5-6 small meals each day.  Take vitamin or mineral supplements as recommended by your dietitian.  If you are malnourished  or if your dietitian recommends it: ? You may follow a high-protein, high-calorie diet. This may include:  2,000-3,000 calories (kilocalories) a day.  70-100 g (grams) of protein a day. ? You may be directed to follow a diet that includes a complete nutritional supplement beverage. This can help to restore calories, protein, and vitamins to your body. Depending on your condition, you may be advised to consume this beverage instead of your meals or in addition to them.  Certain  medicines may cause changes in your appetite, taste, and weight. Work with your health care provider and dietitian to make any changes to your medicines and eating plan.  If you are unable to take in enough food and calories by mouth, your health care provider may recommend a feeding tube. This tube delivers nutritional supplements directly to your stomach. Recommended foods  Eat foods that are high in molecules that prevent oxygen from reacting with your food (antioxidants). These foods include grapes, berries, nuts, green tea, and dark green or orange vegetables. Eating these can help to prevent some of the stress that is placed on your liver by consuming alcohol.  Eat a variety of fresh fruits and vegetables each day. This will help you to get fiber and vitamins in your diet.  Drink plenty of water and other clear fluids, such as apple juice and broth. Try to drink at least 48-64 oz (1.5-2 L) of water a day.  Include foods fortified with vitamins and minerals in your diet. Commonly fortified foods include milk, orange juice, cereal, and bread.  Eat a variety of foods that are high in omega-3 and omega-6 fatty acids. These include fish, nuts and seeds, and soybeans. These foods may help your liver to recover and may also stabilize your mood.  If you are a vegetarian: ? Eat a variety of protein-rich foods. ? Pair whole grains with plant-based proteins at meals and snack time. For example, eat rice with beans, put peanut butter on whole-grain toast, or eat oatmeal with sunflower seeds. The items listed above may not be a complete list of foods and beverages you can eat. Contact a dietitian for more information. Foods to avoid  Avoid foods and drinks that are high in fat and sugar. Sugary drinks, salty snacks, and candy contain empty calories. This means that they lack important nutrients such as protein, fiber, and vitamins.  Avoid alcohol. This is the best way to avoid malnutrition due to  alcohol abuse. If you must drink, drink measured amounts. Measured drinking means limiting your intake to no more than 1 drink a day for nonpregnant women and 2 drinks a day for men. One drink equals 12 oz (355 mL) of beer, 5 oz (148 mL) of wine, or 1 oz (44 mL) of hard liquor.  Limit your intake of caffeine. Replace drinks like coffee and black tea with decaffeinated coffee and decaffeinated herbal tea. The items listed above may not be a complete list of foods and beverages you should avoid. Contact a dietitian for more information. Summary  Alcohol abuse can cause poor nutrition (malnutrition or malnourishment) and a lack of nutrients (nutrient deficiencies), which can lead to more health problems.  Common nutrient deficiencies include vitamin deficiencies (A, B, C, and D) and mineral deficiencies (calcium, iron, magnesium, and zinc).  Nutrition is an essential factor in therapy for alcohol abuse.  Your health care provider and dietitian can help you to develop a specific eating plan that includes a balanced diet plus vitamin and  mineral supplements. This information is not intended to replace advice given to you by your health care provider. Make sure you discuss any questions you have with your health care provider. Document Revised: 02/05/2019 Document Reviewed: 07/04/2017 Elsevier Patient Education  2020 Elsevier Inc.  

## 2020-01-23 ENCOUNTER — Other Ambulatory Visit: Payer: Self-pay

## 2020-01-24 ENCOUNTER — Other Ambulatory Visit: Payer: Self-pay

## 2020-01-24 ENCOUNTER — Ambulatory Visit (INDEPENDENT_AMBULATORY_CARE_PROVIDER_SITE_OTHER): Payer: BC Managed Care – PPO | Admitting: Internal Medicine

## 2020-01-24 ENCOUNTER — Encounter: Payer: Self-pay | Admitting: Internal Medicine

## 2020-01-24 VITALS — BP 120/59 | HR 93 | Temp 97.7°F | Resp 16 | Ht 65.75 in | Wt 148.1 lb

## 2020-01-24 DIAGNOSIS — F419 Anxiety disorder, unspecified: Secondary | ICD-10-CM

## 2020-01-24 DIAGNOSIS — F32A Depression, unspecified: Secondary | ICD-10-CM

## 2020-01-24 DIAGNOSIS — F329 Major depressive disorder, single episode, unspecified: Secondary | ICD-10-CM

## 2020-01-24 DIAGNOSIS — R413 Other amnesia: Secondary | ICD-10-CM | POA: Diagnosis not present

## 2020-01-24 DIAGNOSIS — F1010 Alcohol abuse, uncomplicated: Secondary | ICD-10-CM | POA: Diagnosis not present

## 2020-01-24 MED ORDER — HYDROCORTISONE 2.5 % EX CREA: TOPICAL_CREAM | Freq: Two times a day (BID) | CUTANEOUS | 0 refills | Status: AC

## 2020-01-24 NOTE — Progress Notes (Signed)
Subjective:    Patient ID: Denise Benitez, female    DOB: 11/26/1965, 54 y.o.   MRN: [id number]  DOS:  01/24/2020 Type of visit - description: Acute Patient here at my request, neurology suspected alcohol abuse. We talk about alcohol intake and  depression  She also requested advice about a rash that she has at the sides of the nose (thinks related to using a mask)    Review of Systems See above   Past Medical History:  Diagnosis Date  . ADD (attention deficit disorder)   . Anxiety   . Asthma    as a child, "no longer have it"  . Bartholin gland cyst   . Colon polyp    adenomatous  . Diverticulosis   . Elevated LFTs   . GERD (gastroesophageal reflux disease)   . Hemorrhoids   . Hyperlipidemia   . Hypothyroidism    patient denies  . Perimenopause   . Pneumonia   . Seasonal allergies   . Varicose veins     Past Surgical History:  Procedure Laterality Date  . CHOLECYSTECTOMY  1995  . DECORTICATION Right 10/03/2019   Procedure: DECORTICATION;  Surgeon: Melrose Nakayama, MD;  Location: [place];  Service: Thoracic;  Laterality: Right;  . OVARIAN CYST REMOVAL Right   . PELVIC LAPAROSCOPY     ovarian cystectomy  . PLEURAL EFFUSION DRAINAGE Right 10/03/2019   Procedure: DRAINAGE OF PLEURAL EFFUSION;  Surgeon: Melrose Nakayama, MD;  Location: [place];  Service: Thoracic;  Laterality: Right;  . [place]  . VIDEO ASSISTED THORACOSCOPY Right 10/03/2019   Procedure: Right VIDEO ASSISTED THORACOSCOPY;  Surgeon: Melrose Nakayama, MD;  Location: [place];  Service: Thoracic;  Laterality: Right;    Allergies as of 01/24/2020      Reactions   Codeine Itching      Medication List       Accurate as of January 24, 2020 11:59 PM. If you have any questions, ask your nurse or doctor.        ALPRAZolam 0.5 MG tablet Commonly known as: Xanax Take 0.5-1 tablets (0.25-0.5 mg total) by mouth at bedtime as needed for sleep.   aspirin 81 MG tablet Take 81 mg by  mouth daily.   b complex vitamins capsule Take 1 capsule by mouth 3 (three) times daily.   Biotin 1000 MCG tablet Take 1,000 mcg by mouth daily.   CALCIUM 600 PO Take 1 tablet by mouth daily.   cholecalciferol 25 MCG (1000 UNIT) tablet Commonly known as: VITAMIN D3 Take 1,000 Units by mouth daily.   Fish Oil 1200 MG Caps Take 1,200 mg by mouth daily.   hydrocortisone 2.5 % cream Apply topically 2 (two) times daily. Started by: Kathlene November, MD   vitamin C 500 MG tablet Commonly known as: ASCORBIC ACID Take 500 mg by mouth daily.          Objective:   Physical Exam BP (!) 120/59 (BP Location: Left Arm, Patient Position: Sitting, Cuff Size: Small)   Pulse 93   Temp 97.7 F (36.5 C) (Temporal)   Resp 16   Ht 5' 5.75" (1.67 m)   Wt 148 lb 2 oz (67.2 kg)   LMP 12/01/2016   SpO2 96%   BMI 24.09 kg/m  General:   Well developed, NAD, BMI noted. HEENT:  Normocephalic . Face symmetric, atraumatic Neurologic:  alert & oriented X3.  Speech normal, gait appropriate for age and unassisted Psych--  Cognition  and judgment appear intact.  Cooperative with normal attention span and concentration.  Behavior appropriate. Slightly apprehensive/anxious but not depressed appearing.      Assessment     ASSESSMENT  (new patient 06-2019, referred by Mr. Blaine Hamper) History of Raynaud phenomena, on aspirin Hyperlipidemia R pleural effusion, VATS, Bx (-) for malignancy Smoker quit  Thyroid disease History of ADD  PLAN: Binge drinking: See neurology note, they are very concerned about alcohol abuse, today we have extended conversation with the patient, she does not drink daily but when she does she takes "6-7 beers". Denies to me blacking out, having severe hangovers or feeling guilty about it. Father was an alcoholic. I educated the patient about the risk of binge drinking/excessive drinking including physical, mental problems. We talk about AA meetings, she is completely  opposed to that option, she think that she might have a problem but "is not that serious". Again we agreed on moderation. MCI: Saw neurology, she had some objective evidence of short-term memory loss, they noticed her to be very anxious and depressed.  They are very concerned about her binge drinking recommended even residential stay. They Rx brain MRI, thiamine and B12 which I encouraged for her to do. Anxiety depression: Related to her marriage, overall things are little better now than they were few months ago but they are far from perfect.  Advised about possibly psychotherapy or couples counseling and she is really not interested.  Previously, she self stopped Lexapro.  Will reassess SSRIs at the next opportunity. RTC scheduled for 02-2020.   Time spent 35 minutes  This visit occurred during the SARS-CoV-2 public health emergency.  Safety protocols were in place, including screening questions prior to the visit, additional usage of staff PPE, and extensive cleaning of exam room while observing appropriate contact time as indicated for disinfecting solutions.

## 2020-01-24 NOTE — Patient Instructions (Addendum)
Apply cream twice a day as needed  Vitamin B12 daily Vitamin B1 (thiamine) daily

## 2020-01-24 NOTE — Progress Notes (Signed)
Pre visit review using our clinic review tool, if applicable. No additional management support is needed unless otherwise documented below in the visit note. 

## 2020-01-26 NOTE — Assessment & Plan Note (Signed)
Binge drinking: See neurology note, they are very concerned about alcohol abuse, today we have extended conversation with the patient, she does not drink daily but when she does she takes "6-7 beers". Denies to me blacking out, having severe hangovers or feeling guilty about it. Father was an alcoholic. I educated the patient about the risk of binge drinking/excessive drinking including physical, mental problems. We talk about AA meetings, she is completely opposed to that option, she think that she might have a problem but "is not that serious". Again we agreed on moderation. MCI: Saw neurology, she had some objective evidence of short-term memory loss, they noticed her to be very anxious and depressed.  They are very concerned about her binge drinking recommended even residential stay. They Rx brain MRI, thiamine and B12 which I encouraged for her to do. Anxiety depression: Related to her marriage, overall things are little better now than they were few months ago but they are far from perfect.  Advised about possibly psychotherapy or couples counseling and she is really not interested.  Previously, she self stopped Lexapro.  Will reassess SSRIs at the next opportunity. RTC scheduled for 02-2020.

## 2020-01-28 ENCOUNTER — Telehealth: Payer: Self-pay | Admitting: Neurology

## 2020-01-28 NOTE — Telephone Encounter (Signed)
spoke to the patient she will give me a call back to schedule  [employer] Auth: [id number] (exp. 01/28/20 to 07/25/20)

## 2020-01-30 ENCOUNTER — Encounter: Payer: Self-pay | Admitting: Neurology

## 2020-01-30 LAB — COMPREHENSIVE METABOLIC PANEL
ALT: 13 IU/L (ref 0–32)
AST: 19 IU/L (ref 0–40)
Albumin/Globulin Ratio: 1.4 (ref 1.2–2.2)
Albumin: 4.6 g/dL (ref 3.8–4.9)
Alkaline Phosphatase: 132 IU/L — ABNORMAL HIGH (ref 39–117)
BUN/Creatinine Ratio: 20 (ref 9–23)
BUN: 18 mg/dL (ref 6–24)
Bilirubin Total: 0.5 mg/dL (ref 0.0–1.2)
CO2: 24 mmol/L (ref 20–29)
Calcium: 10.5 mg/dL — ABNORMAL HIGH (ref 8.7–10.2)
Chloride: 100 mmol/L (ref 96–106)
Creatinine, Ser: 0.88 mg/dL (ref 0.57–1.00)
GFR calc Af Amer: 85 mL/min/{1.73_m2} (ref >59)
GFR calc non Af Amer: 74 mL/min/{1.73_m2} (ref >59)
Globulin, Total: 3.3 g/dL (ref 1.5–4.5)
Glucose: 94 mg/dL (ref 65–99)
Potassium: 4.8 mmol/L (ref 3.5–5.2)
Sodium: 138 mmol/L (ref 134–144)
Total Protein: 7.9 g/dL (ref 6.0–8.5)

## 2020-01-30 LAB — VITAMIN B1: Thiamine: 60.4 nmol/L — ABNORMAL LOW (ref 66.5–200.0)

## 2020-03-02 ENCOUNTER — Ambulatory Visit: Payer: BC Managed Care – PPO | Admitting: Internal Medicine

## 2020-03-04 ENCOUNTER — Ambulatory Visit: Payer: BC Managed Care – PPO | Admitting: Internal Medicine

## 2020-03-10 ENCOUNTER — Other Ambulatory Visit: Payer: Self-pay | Admitting: Internal Medicine

## 2020-03-10 DIAGNOSIS — Z1231 Encounter for screening mammogram for malignant neoplasm of breast: Secondary | ICD-10-CM

## 2020-03-18 ENCOUNTER — Telehealth: Payer: Self-pay | Admitting: Internal Medicine

## 2020-03-18 NOTE — Telephone Encounter (Signed)
Spoke to Patient husband Johnsie Cancel. Husband stated he understood.Marland Kitchen

## 2020-03-18 NOTE — Telephone Encounter (Signed)
Caller: Johnsie Cancel (husband) Call back number: [phone] [place]) or [phone] Johnsie Cancel)  Patient's husband is calling asking if Dr. Larose Kells could possibly order an x-ray for his wife lungs. Patient's husband states patient had surgery back in 2020. Patient would like x-ray to be done before her next appointment 03/24/20 to make sure all fluid are completely gone.  FYI: patient speaks spanish.  Please advise

## 2020-03-18 NOTE — Telephone Encounter (Signed)
Advise patient or her husband: Will do a chest x-ray at the next visit

## 2020-03-23 ENCOUNTER — Ambulatory Visit: Payer: BC Managed Care – PPO | Admitting: Internal Medicine

## 2020-03-24 ENCOUNTER — Other Ambulatory Visit: Payer: Self-pay

## 2020-03-24 ENCOUNTER — Ambulatory Visit (INDEPENDENT_AMBULATORY_CARE_PROVIDER_SITE_OTHER): Payer: BC Managed Care – PPO | Admitting: Internal Medicine

## 2020-03-24 ENCOUNTER — Ambulatory Visit (HOSPITAL_BASED_OUTPATIENT_CLINIC_OR_DEPARTMENT_OTHER)
Admission: RE | Admit: 2020-03-24 | Discharge: 2020-03-24 | Disposition: A | Discharge status: Home / Self Care | Payer: BC Managed Care – PPO | Source: Ambulatory Visit | Attending: Internal Medicine | Admitting: Internal Medicine

## 2020-03-24 ENCOUNTER — Encounter: Payer: Self-pay | Admitting: Internal Medicine

## 2020-03-24 VITALS — BP 123/75 | HR 104 | Temp 97.1°F | Resp 18 | Ht 66.0 in | Wt 140.5 lb

## 2020-03-24 DIAGNOSIS — Z23 Encounter for immunization: Secondary | ICD-10-CM | POA: Diagnosis not present

## 2020-03-24 DIAGNOSIS — G3184 Mild cognitive impairment, so stated: Secondary | ICD-10-CM | POA: Diagnosis not present

## 2020-03-24 DIAGNOSIS — F419 Anxiety disorder, unspecified: Secondary | ICD-10-CM

## 2020-03-24 DIAGNOSIS — J90 Pleural effusion, not elsewhere classified: Secondary | ICD-10-CM | POA: Insufficient documentation

## 2020-03-24 DIAGNOSIS — F329 Major depressive disorder, single episode, unspecified: Secondary | ICD-10-CM

## 2020-03-24 DIAGNOSIS — E519 Thiamine deficiency, unspecified: Secondary | ICD-10-CM | POA: Diagnosis not present

## 2020-03-24 DIAGNOSIS — R0602 Shortness of breath: Secondary | ICD-10-CM | POA: Diagnosis not present

## 2020-03-24 DIAGNOSIS — F32A Depression, unspecified: Secondary | ICD-10-CM

## 2020-03-24 NOTE — Progress Notes (Signed)
cPre visit review using our clinic review tool, if applicable. No additional management support is needed unless otherwise documented below in the visit note.

## 2020-03-24 NOTE — Patient Instructions (Addendum)
[facility] call the [facility] to schedule your mammogram. Their number is [phone].   GO TO THE LAB : Get the blood work     [place], [place] Come back for a physical exam in 3 months   Go to the first floor, get your chest x-ray

## 2020-03-24 NOTE — Progress Notes (Signed)
Subjective:    Patient ID: Denise Benitez, female    DOB: [birth year], 54 y.o.   MRN: [id number]  DOS:  03/24/2020 Type of visit - description: Follow-up Multiple issues discussed today. In general her main concern is DOE. She was doing okay with some DOE with normal activities at home after she had surgery on December 2020, VATS. Now she is back to work, she is walking longer distances and the DOE is quite noticeable.  Weight is noted to be down, she reports that at her own scales weight seems satisfactory, unchanged.  Still has insomnia, uses Xanax as needed, emotionally otherwise she is doing okay.  Wt Readings from Last 3 Encounters:  03/24/20 140 lb 8 oz (63.7 kg)  01/24/20 148 lb 2 oz (67.2 kg)  01/22/20 145 lb (65.8 kg)      Review of Systems Denies fever chills. Has chronic right-sided chest pain with cough or movement. No nausea, vomiting, diarrhea or blood in the stools No cough or sputum production  Past Medical History:  Diagnosis Date  . ADD (attention deficit disorder)   . Anxiety   . Asthma    as a child, "no longer have it"  . Bartholin gland cyst   . Colon polyp    adenomatous  . Diverticulosis   . Elevated LFTs   . GERD (gastroesophageal reflux disease)   . Hemorrhoids   . Hyperlipidemia   . Hypothyroidism    patient denies  . Perimenopause   . Pneumonia   . Seasonal allergies   . Varicose veins     Past Surgical History:  Procedure Laterality Date  . CHOLECYSTECTOMY  1995  . DECORTICATION Right 10/03/2019   Procedure: DECORTICATION;  Surgeon: Melrose Nakayama, MD;  Location: [facility];  Service: Thoracic;  Laterality: Right;  . OVARIAN CYST REMOVAL Right   . PELVIC LAPAROSCOPY     ovarian cystectomy  . PLEURAL EFFUSION DRAINAGE Right 10/03/2019   Procedure: DRAINAGE OF PLEURAL EFFUSION;  Surgeon: Melrose Nakayama, MD;  Location: [facility];  Service: Thoracic;  Laterality: Right;  . [place]  . VIDEO ASSISTED THORACOSCOPY  Right 10/03/2019   Procedure: Right VIDEO ASSISTED THORACOSCOPY;  Surgeon: Melrose Nakayama, MD;  Location: [facility];  Service: Thoracic;  Laterality: Right;    Allergies as of 03/24/2020      Reactions   Codeine Itching      Medication List       Accurate as of Mar 24, 2020  3:00 PM. If you have any questions, ask your nurse or doctor.        ALPRAZolam 0.5 MG tablet Commonly known as: Xanax Take 0.5-1 tablets (0.25-0.5 mg total) by mouth at bedtime as needed for sleep.   aspirin 81 MG tablet Take 81 mg by mouth daily.   b complex vitamins capsule Take 1 capsule by mouth 3 (three) times daily.   Biotin 1000 MCG tablet Take 1,000 mcg by mouth daily.   CALCIUM 600 PO Take 1 tablet by mouth daily.   cholecalciferol 25 MCG (1000 UNIT) tablet Commonly known as: VITAMIN D3 Take 1,000 Units by mouth daily.   Fish Oil 1200 MG Caps Take 1,200 mg by mouth daily.   hydrocortisone 2.5 % cream Apply topically 2 (two) times daily.   vitamin C 500 MG tablet Commonly known as: ASCORBIC ACID Take 500 mg by mouth daily.          Objective:   Physical Exam Resp 16  Ht 5\' 6"  (1.676 m)   Wt 140 lb 8 oz (63.7 kg)   LMP 12/01/2016   BMI 22.68 kg/m  General:   Well developed, NAD, BMI noted.  HEENT:  Normocephalic . Face symmetric, atraumatic Lungs:  Slightly decreased breath sounds at actually both bases Normal respiratory effort, no intercostal retractions, no accessory muscle use. Chest wall: Slightly TTP at the right lateral distal chest Heart: RRR,  no murmur.  Abdomen:  Not distended, soft, non-tender. No rebound or rigidity.   Skin: Not pale. Not jaundice Lower extremities: no pretibial edema bilaterally  Neurologic:  alert & oriented X3.  Speech normal, gait appropriate for age and unassisted Psych--  Cognition and judgment appear intact.  Cooperative with normal attention span and concentration.  Behavior appropriate. No anxious or depressed  appearing.     Assessment      ASSESSMENT  (new patient 06-2019, referred by Mr. Blaine Hamper) History of Raynaud phenomena, on aspirin Hyperlipidemia R pleural effusion, VATS, Bx (-) for malignancy Smoker quit  Thyroid disease History of ADD  PLAN: Multiple issues discussed: R pleural effusion: Since she had VATS in December, was mildly short of breath with routine activities, went back to work last month and is obviously doing more walking and she has noted more DOE. Check a chest x-ray. Binge drinking: Reports she only drinks on the weekends and "less than before". MCI: She still reports decreased short-term memory, neurology recommended a brain MRI, cost is an issue. Low thiamine level: Found to have a low thiamine level when neurology check on her, currently on a B complex supplement.  Check a folic acid, 123456 and thiamine levels Anxiety, depression, insomnia: At this point she is doing okay.  PHQ-9 screening decreased to 2 (from 4). Work is okay, relationship with husband is okay, they argue from time to time.  No physical abuse.  Currently on no medications other than Xanax as needed for insomnia.  Reassess periodically. Anemia: Last hemoglobin improving, GI ROS negative, had a colonoscopy 09-2015, next 5 years. Weight loss: + Weight loss noted in our scales, reports that at her home scales, weight is stable.  No change. Mild increased alkaline phosphate and calcium: Recheck on RTC Preventive care: Had Covid shots , PNM 23 today RTC CPX 3 months    This visit occurred during the SARS-CoV-2 public health emergency.  Safety protocols were in place, including screening questions prior to the visit, additional usage of staff PPE, and extensive cleaning of exam room while observing appropriate contact time as indicated for disinfecting solutions.

## 2020-03-25 ENCOUNTER — Telehealth: Payer: Self-pay | Admitting: Internal Medicine

## 2020-03-25 LAB — B12 AND FOLATE PANEL
Folate: >23.7 ng/mL (ref >5.9)
Vitamin B-12: 508 pg/mL (ref 211–911)

## 2020-03-25 NOTE — Telephone Encounter (Signed)
Spoke with the patient, explained that that a referral to a different practice will take more time. Nevertheless that is what she desires. Please arrange a referral to thoracic surgery in [facility]. Patient to let me know if symptoms increase

## 2020-03-25 NOTE — Assessment & Plan Note (Signed)
Multiple issues discussed: R pleural effusion: Since she had VATS in December, was mildly short of breath with routine activities, went back to work last month and is obviously doing more walking and she has noted more DOE. Check a chest x-ray. Binge drinking: Reports she only drinks on the weekends and "less than before". MCI: She still reports decreased short-term memory, neurology recommended a brain MRI, cost is an issue. Low thiamine level: Found to have a low thiamine level when neurology check on her, currently on a B complex supplement.  Check a folic acid, 123456 and thiamine levels Anxiety, depression, insomnia: At this point she is doing okay.  PHQ-9 screening decreased to 2 (from 4). Work is okay, relationship with husband is okay, they argue from time to time.  No physical abuse.  Currently on no medications other than Xanax as needed for insomnia.  Reassess periodically. Anemia: Last hemoglobin improving, GI ROS negative, had a colonoscopy 09-2015, next 5 years. Weight loss: + Weight loss noted in our scales, reports that at her home scales, weight is stable.  No change. Mild increased alkaline phosphate and calcium: Recheck on RTC Preventive care: Had Covid shots , PNM 23 today RTC CPX 3 months

## 2020-03-25 NOTE — Telephone Encounter (Signed)
Caller: [place] Call back phone number: [phone]  Denise Benitez is calling back in regards to the referral to Dr. Roxan Hockey. Patient states she does not want to see him. Patient states can you please refer her to someone different.

## 2020-03-25 NOTE — Telephone Encounter (Signed)
Referral to Dr. Roxan Hockey cancelled.  Referral placed to Cardiothoracic Surgery - [facility]  [address], [place], [place] [address] Telephone: [phone]

## 2020-03-26 ENCOUNTER — Telehealth: Payer: Self-pay | Admitting: Internal Medicine

## 2020-03-26 NOTE — Telephone Encounter (Signed)
Please advise 

## 2020-03-26 NOTE — Telephone Encounter (Signed)
Kennyth Lose or Mitzo- please let Pt know that I have sent work letter to her Mychart. Thank you.

## 2020-03-26 NOTE — Telephone Encounter (Signed)
Pt came in office stating was seen in our office on 03-24-2020 and stated that has not been feeling well since then (pt has a referral per Larose Kells to see a specialist Cardiothoracic surgery ), pt missed going to work today 03-26-2020 and will not be able to go tomorrow to work 03-27-2020. Pt is wanting to know if she can get a work excuse note from 03-26-2020 to return back on 03-28-2020 (pt states will not know how she is going to feel depending on her referral and when the specialist is going to call her but for now pt thinks will be able to go back on 03-28-2020). Please advice. Pt tel [phone].

## 2020-03-26 NOTE — Telephone Encounter (Signed)
That is okay.  Thank you 

## 2020-03-27 ENCOUNTER — Encounter: Payer: Self-pay | Admitting: Internal Medicine

## 2020-03-27 LAB — VITAMIN B1: Vitamin B1 (Thiamine): 28 nmol/L (ref 8–30)

## 2020-03-27 NOTE — Telephone Encounter (Signed)
LVM informing pt about letter sent to [facility]. Done

## 2020-03-31 ENCOUNTER — Encounter: Payer: BC Managed Care – PPO | Admitting: Thoracic Surgery (Cardiothoracic Vascular Surgery)

## 2020-04-06 ENCOUNTER — Telehealth: Payer: Self-pay | Admitting: Internal Medicine

## 2020-04-06 NOTE — Telephone Encounter (Signed)
Caller: [name] Call back phone number [phone]  Patient is checking the status of the referral.  Please advise.

## 2020-04-06 NOTE — Telephone Encounter (Signed)
Type Date User Summary Attachment  General 04/06/2020  9:34 AM Margot Ables - -  Note   Faxed via ROI to  [facility] [place] at [facility] and [address] [place], [place] [address] [phone] (ph) [phone] Joylene Igo)

## 2020-04-06 NOTE — Telephone Encounter (Signed)
Pt was informed on 03/25/20 by Dr. Larose Kells that referral may take several weeks/months to get into a different cardiothoracic surgeon. Referral was faxed to [facility] [place] this morning.

## 2020-04-08 ENCOUNTER — Ambulatory Visit
Admission: RE | Admit: 2020-04-08 | Discharge: 2020-04-08 | Disposition: A | Discharge status: Home / Self Care | Payer: BC Managed Care – PPO | Source: Ambulatory Visit | Attending: Internal Medicine | Admitting: Internal Medicine

## 2020-04-08 ENCOUNTER — Other Ambulatory Visit: Payer: Self-pay

## 2020-04-08 DIAGNOSIS — Z1231 Encounter for screening mammogram for malignant neoplasm of breast: Secondary | ICD-10-CM

## 2020-04-09 ENCOUNTER — Ambulatory Visit: Payer: BC Managed Care – PPO

## 2020-04-20 ENCOUNTER — Telehealth: Payer: Self-pay | Admitting: Internal Medicine

## 2020-04-20 NOTE — Telephone Encounter (Signed)
Please call thoracic surgery office and see about her referral.  I saw the patient's husband today and she is still having a very hard time breathing.

## 2020-04-20 NOTE — Telephone Encounter (Signed)
LMOM at [facility] cardiothoracic surgery office in [facility]- informed checking status of referral. Awaiting call back.

## 2020-04-22 NOTE — Telephone Encounter (Signed)
Appt w/ cardiothoracic tomorrow.

## 2020-04-22 NOTE — Telephone Encounter (Signed)
Spoke w/ Pt's husband Johnsie Cancel- they are aware of appt. He thanked me for calling.

## 2020-04-22 NOTE — Telephone Encounter (Signed)
Please be sure the patient is aware

## 2020-04-23 DIAGNOSIS — J90 Pleural effusion, not elsewhere classified: Secondary | ICD-10-CM | POA: Diagnosis not present

## 2020-04-29 DIAGNOSIS — J90 Pleural effusion, not elsewhere classified: Secondary | ICD-10-CM | POA: Diagnosis not present

## 2020-04-30 DIAGNOSIS — J90 Pleural effusion, not elsewhere classified: Secondary | ICD-10-CM | POA: Diagnosis not present

## 2020-05-05 DIAGNOSIS — J90 Pleural effusion, not elsewhere classified: Secondary | ICD-10-CM | POA: Diagnosis not present

## 2020-05-05 DIAGNOSIS — R0600 Dyspnea, unspecified: Secondary | ICD-10-CM | POA: Diagnosis not present

## 2020-05-05 DIAGNOSIS — I272 Pulmonary hypertension, unspecified: Secondary | ICD-10-CM | POA: Diagnosis not present

## 2020-05-05 DIAGNOSIS — I081 Rheumatic disorders of both mitral and tricuspid valves: Secondary | ICD-10-CM | POA: Diagnosis not present

## 2020-05-07 DIAGNOSIS — I050 Rheumatic mitral stenosis: Secondary | ICD-10-CM | POA: Diagnosis not present

## 2020-05-07 DIAGNOSIS — I272 Pulmonary hypertension, unspecified: Secondary | ICD-10-CM | POA: Diagnosis not present

## 2020-05-07 DIAGNOSIS — J90 Pleural effusion, not elsewhere classified: Secondary | ICD-10-CM | POA: Diagnosis not present

## 2020-05-09 DIAGNOSIS — I272 Pulmonary hypertension, unspecified: Secondary | ICD-10-CM | POA: Insufficient documentation

## 2020-05-09 DIAGNOSIS — I050 Rheumatic mitral stenosis: Secondary | ICD-10-CM | POA: Insufficient documentation

## 2020-06-04 DIAGNOSIS — I340 Nonrheumatic mitral (valve) insufficiency: Secondary | ICD-10-CM | POA: Diagnosis not present

## 2020-06-04 DIAGNOSIS — I272 Pulmonary hypertension, unspecified: Secondary | ICD-10-CM | POA: Diagnosis not present

## 2020-06-17 DIAGNOSIS — I081 Rheumatic disorders of both mitral and tricuspid valves: Secondary | ICD-10-CM | POA: Diagnosis not present

## 2020-06-17 DIAGNOSIS — Q211 Atrial septal defect: Secondary | ICD-10-CM | POA: Diagnosis not present

## 2020-06-17 DIAGNOSIS — I342 Nonrheumatic mitral (valve) stenosis: Secondary | ICD-10-CM | POA: Diagnosis not present

## 2020-06-17 DIAGNOSIS — I050 Rheumatic mitral stenosis: Secondary | ICD-10-CM | POA: Diagnosis not present

## 2020-06-17 DIAGNOSIS — I052 Rheumatic mitral stenosis with insufficiency: Secondary | ICD-10-CM | POA: Diagnosis not present

## 2020-06-17 DIAGNOSIS — I272 Pulmonary hypertension, unspecified: Secondary | ICD-10-CM | POA: Diagnosis not present

## 2020-06-18 DIAGNOSIS — I050 Rheumatic mitral stenosis: Secondary | ICD-10-CM | POA: Diagnosis not present

## 2020-06-23 ENCOUNTER — Encounter: Payer: BC Managed Care – PPO | Admitting: Internal Medicine

## 2020-06-24 ENCOUNTER — Encounter: Payer: Self-pay | Admitting: Internal Medicine

## 2020-07-03 ENCOUNTER — Other Ambulatory Visit: Payer: Self-pay

## 2020-07-03 MED ORDER — ALPRAZOLAM 0.5 MG PO TABS: 0.2500 mg | ORAL_TABLET | Freq: Every evening | ORAL | 1 refills | Status: AC | PRN

## 2020-07-03 NOTE — Telephone Encounter (Signed)
Alprazolam refill. Denise Benitez.  Last OV: 03/24/2020, appt 07/10/20 Last Fill: 10/16/2019 #30 and 0rf Benitez sig: 1/2 to 1 tablet qhs UDS: 12/04/2019 Low risk

## 2020-07-10 ENCOUNTER — Ambulatory Visit: Payer: BC Managed Care – PPO | Admitting: Internal Medicine

## 2020-07-13 ENCOUNTER — Other Ambulatory Visit: Payer: Self-pay

## 2020-07-13 ENCOUNTER — Ambulatory Visit (INDEPENDENT_AMBULATORY_CARE_PROVIDER_SITE_OTHER): Payer: BC Managed Care – PPO | Admitting: Internal Medicine

## 2020-07-13 ENCOUNTER — Encounter: Payer: Self-pay | Admitting: Internal Medicine

## 2020-07-13 VITALS — BP 123/80 | HR 95 | Temp 98.6°F | Resp 18 | Ht 66.0 in | Wt 148.2 lb

## 2020-07-13 DIAGNOSIS — I272 Pulmonary hypertension, unspecified: Secondary | ICD-10-CM

## 2020-07-13 DIAGNOSIS — I050 Rheumatic mitral stenosis: Secondary | ICD-10-CM

## 2020-07-13 DIAGNOSIS — E519 Thiamine deficiency, unspecified: Secondary | ICD-10-CM | POA: Diagnosis not present

## 2020-07-13 DIAGNOSIS — J90 Pleural effusion, not elsewhere classified: Secondary | ICD-10-CM

## 2020-07-13 DIAGNOSIS — Z789 Other specified health status: Secondary | ICD-10-CM

## 2020-07-13 DIAGNOSIS — R7989 Other specified abnormal findings of blood chemistry: Secondary | ICD-10-CM

## 2020-07-13 DIAGNOSIS — R748 Abnormal levels of other serum enzymes: Secondary | ICD-10-CM

## 2020-07-13 NOTE — Progress Notes (Signed)
Pre visit review using our clinic review tool, if applicable. No additional management support is needed unless otherwise documented below in the visit note. 

## 2020-07-13 NOTE — Patient Instructions (Addendum)
   GO TO THE LAB : Get the blood work     GO TO THE FRONT DESK, [place] Come back for   a physical exam in 4 months, fasting.

## 2020-07-13 NOTE — Progress Notes (Signed)
Subjective:    Patient ID: Denise Benitez, female    DOB: 1966/10/30, 54 y.o.   MRN: [id number]  DOS:  07/13/2020 Type of visit - description: Follow-up, here with her husband  Since the last visit, she went to a different cardiac surgeon for evaluation of pleural effusion. Had left-sided thoracentesis. Echocardiogram showed severe mitral stenosis with moderate mitral regurgitation and severe pulmonary hypertension. Subsequently had a cardiac catheterization and plan is to proceed with mitral valve replacement.   Wt Readings from Last 3 Encounters:  07/13/20 148 lb 4 oz (67.2 kg)  03/24/20 140 lb 8 oz (63.7 kg)  01/24/20 148 lb 2 oz (67.2 kg)    Review of Systems In general feels well/at baseline. No anxiety or depression. Insomnia well controlled.  Past Medical History:  Diagnosis Date  . ADD (attention deficit disorder)   . Anxiety   . Asthma    as a child, "no longer have it"  . Bartholin gland cyst   . Colon polyp    adenomatous  . Diverticulosis   . Elevated LFTs   . GERD (gastroesophageal reflux disease)   . Hemorrhoids   . Hyperlipidemia   . Hypothyroidism    patient denies  . Perimenopause   . Pneumonia   . Seasonal allergies   . Varicose veins     Past Surgical History:  Procedure Laterality Date  . CHOLECYSTECTOMY  1995  . DECORTICATION Right 10/03/2019   Procedure: DECORTICATION;  Surgeon: Melrose Nakayama, MD;  Location: [facility];  Service: Thoracic;  Laterality: Right;  . OVARIAN CYST REMOVAL Right   . PELVIC LAPAROSCOPY     ovarian cystectomy  . PLEURAL EFFUSION DRAINAGE Right 10/03/2019   Procedure: DRAINAGE OF PLEURAL EFFUSION;  Surgeon: Melrose Nakayama, MD;  Location: [facility];  Service: Thoracic;  Laterality: Right;  . [place]  . VIDEO ASSISTED THORACOSCOPY Right 10/03/2019   Procedure: Right VIDEO ASSISTED THORACOSCOPY;  Surgeon: Melrose Nakayama, MD;  Location: [facility];  Service: Thoracic;  Laterality: Right;     Allergies as of 07/13/2020      Reactions   Codeine Itching      Medication List       Accurate as of July 13, 2020  2:50 PM. If you have any questions, ask your nurse or doctor.        ALPRAZolam 0.5 MG tablet Commonly known as: Xanax Take 0.5-1 tablets (0.25-0.5 mg total) by mouth at bedtime as needed for sleep.   aspirin 81 MG tablet Take 81 mg by mouth daily.   b complex vitamins capsule Take 1 capsule by mouth 3 (three) times daily.   Biotin 1000 MCG tablet Take 1,000 mcg by mouth daily.   CALCIUM 600 PO Take 1 tablet by mouth daily.   cholecalciferol 25 MCG (1000 UNIT) tablet Commonly known as: VITAMIN D3 Take 1,000 Units by mouth daily.   Fish Oil 1200 MG Caps Take 1,200 mg by mouth daily.   hydrocortisone 2.5 % cream Apply topically 2 (two) times daily.   vitamin C 500 MG tablet Commonly known as: ASCORBIC ACID Take 500 mg by mouth daily.          Objective:   Physical Exam BP 123/80 (BP Location: Left Arm, Patient Position: Sitting, Cuff Size: Small)   Pulse 95   Temp 98.6 F (37 C) (Oral)   Resp 18   Ht 5\' 6"  (1.676 m)   Wt 148 lb 4 oz (67.2 kg)  LMP 12/01/2016   SpO2 95%   BMI 23.93 kg/m  General:   Well developed, NAD, BMI noted. HEENT:  Normocephalic . Face symmetric, atraumatic Lungs:  Mild decreased breath sounds at both bases Normal respiratory effort, no intercostal retractions, no accessory muscle use. Heart: RRR, despite mitral disease, is hard to say if she has a clear-cut murmur Lower extremities: no pretibial edema bilaterally.  Significant varicose veins noted. Skin: Mottled skin Legs > arms Neurologic:  alert & oriented X3.  Speech normal, gait appropriate for age and unassisted Psych--  Cognition and judgment appear intact.  Cooperative with normal attention span and concentration.  Behavior appropriate. No anxious or depressed appearing.      Assessment     ASSESSMENT  (new patient 06-2019, referred  by Mr. Blaine Hamper) History of Raynaud phenomena, on aspirin Hyperlipidemia R pleural effusion: - VATS 10/2019, Bx (-) for malignancy -[facility] 2nd opinion 04/2020: echo, cath: Severe mitral stenosis with insufficiency, P-HTN, no CAD, Rx MV repair surgery Smoker quit  Thyroid disease History of ADD Alcohol abuse.  PLAN: Raynaud phenomena: History of, mottled skin noted Severe mitral stenosis with insufficiency and P-HTN: Since the last office visit was evaluated by Dr. Glo Herring at [facility], after extensive work-up she was diagnosed with severe mitral stenosis with insufficiency and is scheduled to have surgery soon. Alcohol abuse: Binge drinking, discussed with patient, recommend moderation or counseling. MCI?  Today she reports she is a stable, memory is okay Low thiamine: On supplement, checking labs Anxiety, depression, insomnia:  Insomnia well controlled on Xanax, RF as needed. As far as anxiety and depression he states "I have been anxious all my life", states no  need for treatment. Anemia: Resolved, hemoglobin 14.6 last month at another office. Mildly increased AP.  Recheck, from pulmonary hypertension? Slight increased TSH in the past: Recheck labs Increase calcium: See last visit, resolved, calcium normal last month Preventive care: Had COVID vaccination, recommend flu shot this season RTC CPX 4 months  Time spent with the patient 35 minutes due to extensive chart review, counseling about alcohol abuse.  This visit occurred during the SARS-CoV-2 public health emergency.  Safety protocols were in place, including screening questions prior to the visit, additional usage of staff PPE, and extensive cleaning of exam room while observing appropriate contact time as indicated for disinfecting solutions.

## 2020-07-14 DIAGNOSIS — Z789 Other specified health status: Secondary | ICD-10-CM | POA: Insufficient documentation

## 2020-07-14 LAB — TSH: TSH: 3.15 mIU/L (ref 0.40–4.50)

## 2020-07-14 LAB — HEPATIC FUNCTION PANEL
AG Ratio: 1.2 (calc) (ref 1.0–2.5)
ALT: 17 U/L (ref 6–29)
AST: 19 U/L (ref 10–35)
Albumin: 4 g/dL (ref 3.6–5.1)
Alkaline phosphatase (APISO): 100 U/L (ref 37–153)
Bilirubin, Direct: 0.2 mg/dL (ref 0.0–0.2)
Globulin: 3.3 g/dL (calc) (ref 1.9–3.7)
Indirect Bilirubin: 0.5 mg/dL (calc) (ref 0.2–1.2)
Total Bilirubin: 0.7 mg/dL (ref 0.2–1.2)
Total Protein: 7.3 g/dL (ref 6.1–8.1)

## 2020-07-14 LAB — B12 AND FOLATE PANEL
Folate: >24 ng/mL
Vitamin B-12: 668 pg/mL (ref 200–1100)

## 2020-07-14 NOTE — Assessment & Plan Note (Signed)
Raynaud phenomena: History of, mottled skin noted Severe mitral stenosis with insufficiency and P-HTN: Since the last office visit was evaluated by Dr. Glo Herring at [facility], after extensive work-up she was diagnosed with severe mitral stenosis with insufficiency and is scheduled to have surgery soon. Alcohol abuse: Binge drinking, discussed with patient, recommend moderation or counseling. MCI?  Today she reports she is a stable, memory is okay Low thiamine: On supplement, checking labs Anxiety, depression, insomnia:  Insomnia well controlled on Xanax, RF as needed. As far as anxiety and depression he states "I have been anxious all my life", states no  need for treatment. Anemia: Resolved, hemoglobin 14.6 last month at another office. Mildly increased AP.  Recheck, from pulmonary hypertension? Slight increased TSH in the past: Recheck labs Increase calcium: See last visit, resolved, calcium normal last month Preventive care: Had COVID vaccination, recommend flu shot this season RTC CPX 4 months

## 2020-07-19 LAB — VITAMIN B1: Vitamin B1 (Thiamine): 32 nmol/L — ABNORMAL HIGH (ref 8–30)

## 2020-08-13 IMAGING — CR DG CHEST 2V
2 series · 2 of 2 positions shown · non-contrast
Comparison: 09/16/2019

CLINICAL DATA: Right pleural effusion

EXAM:
CHEST - 2 VIEW

[w chest pa]
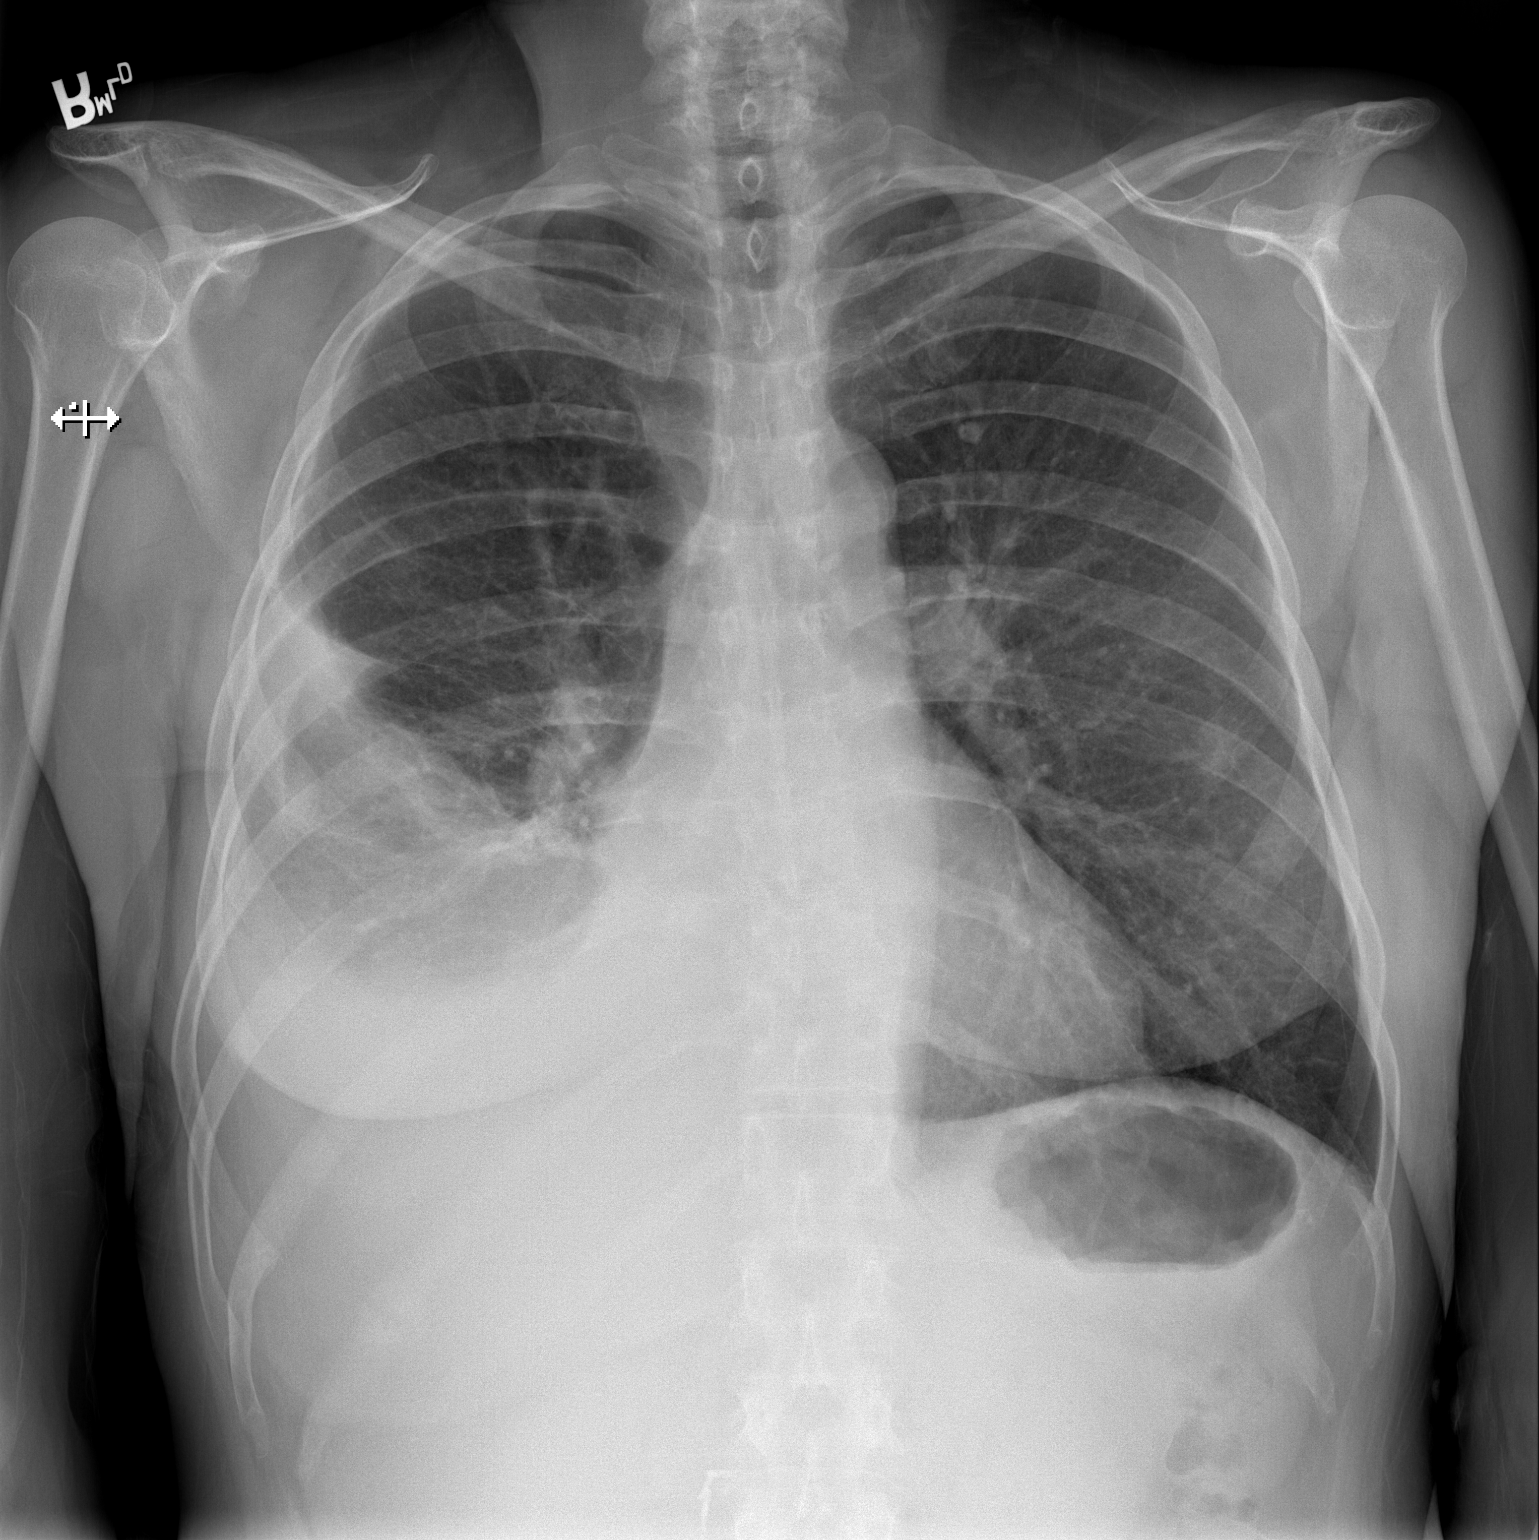

[w chest lat]
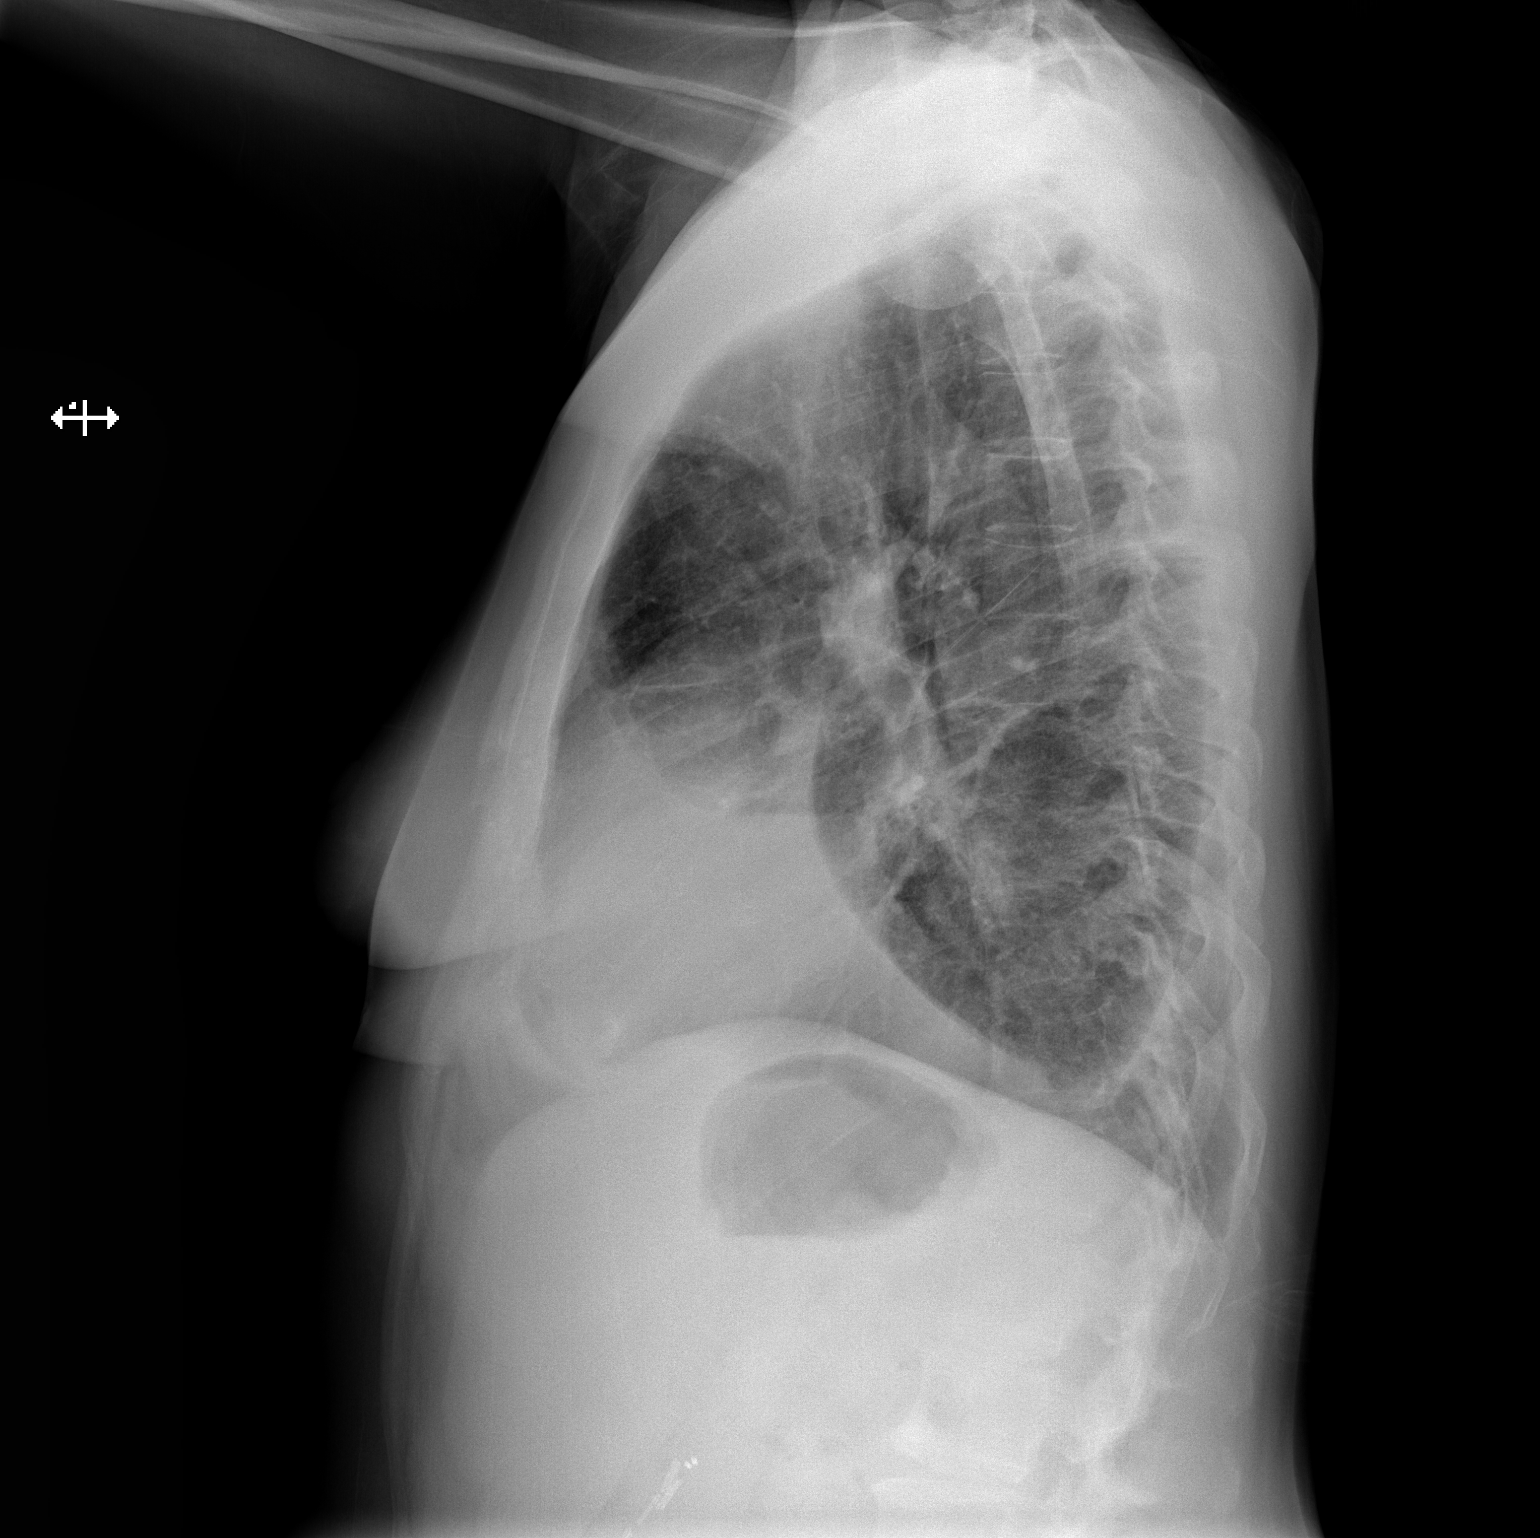

[2 of 2 positions shown; findings below may reference images not displayed]

FINDINGS: Moderate right pleural effusion. Right lower lobe atelectasis. Left
lung clear. Heart is normal size. No acute bony abnormality.
IMPRESSION: Stable moderate-sized right pleural effusion. Right base
atelectasis.

No significant change since prior study.

## 2020-08-15 IMAGING — CR DG CHEST 1V PORT
1 series · 1 of 1 positions shown · non-contrast
Comparison: 10/01/2019 chest radiograph.

CLINICAL DATA: Inpatient. Former smoker. Follow-up pleural effusion
status post right chest tube placement.

EXAM:
PORTABLE CHEST 1 VIEW

[AP]
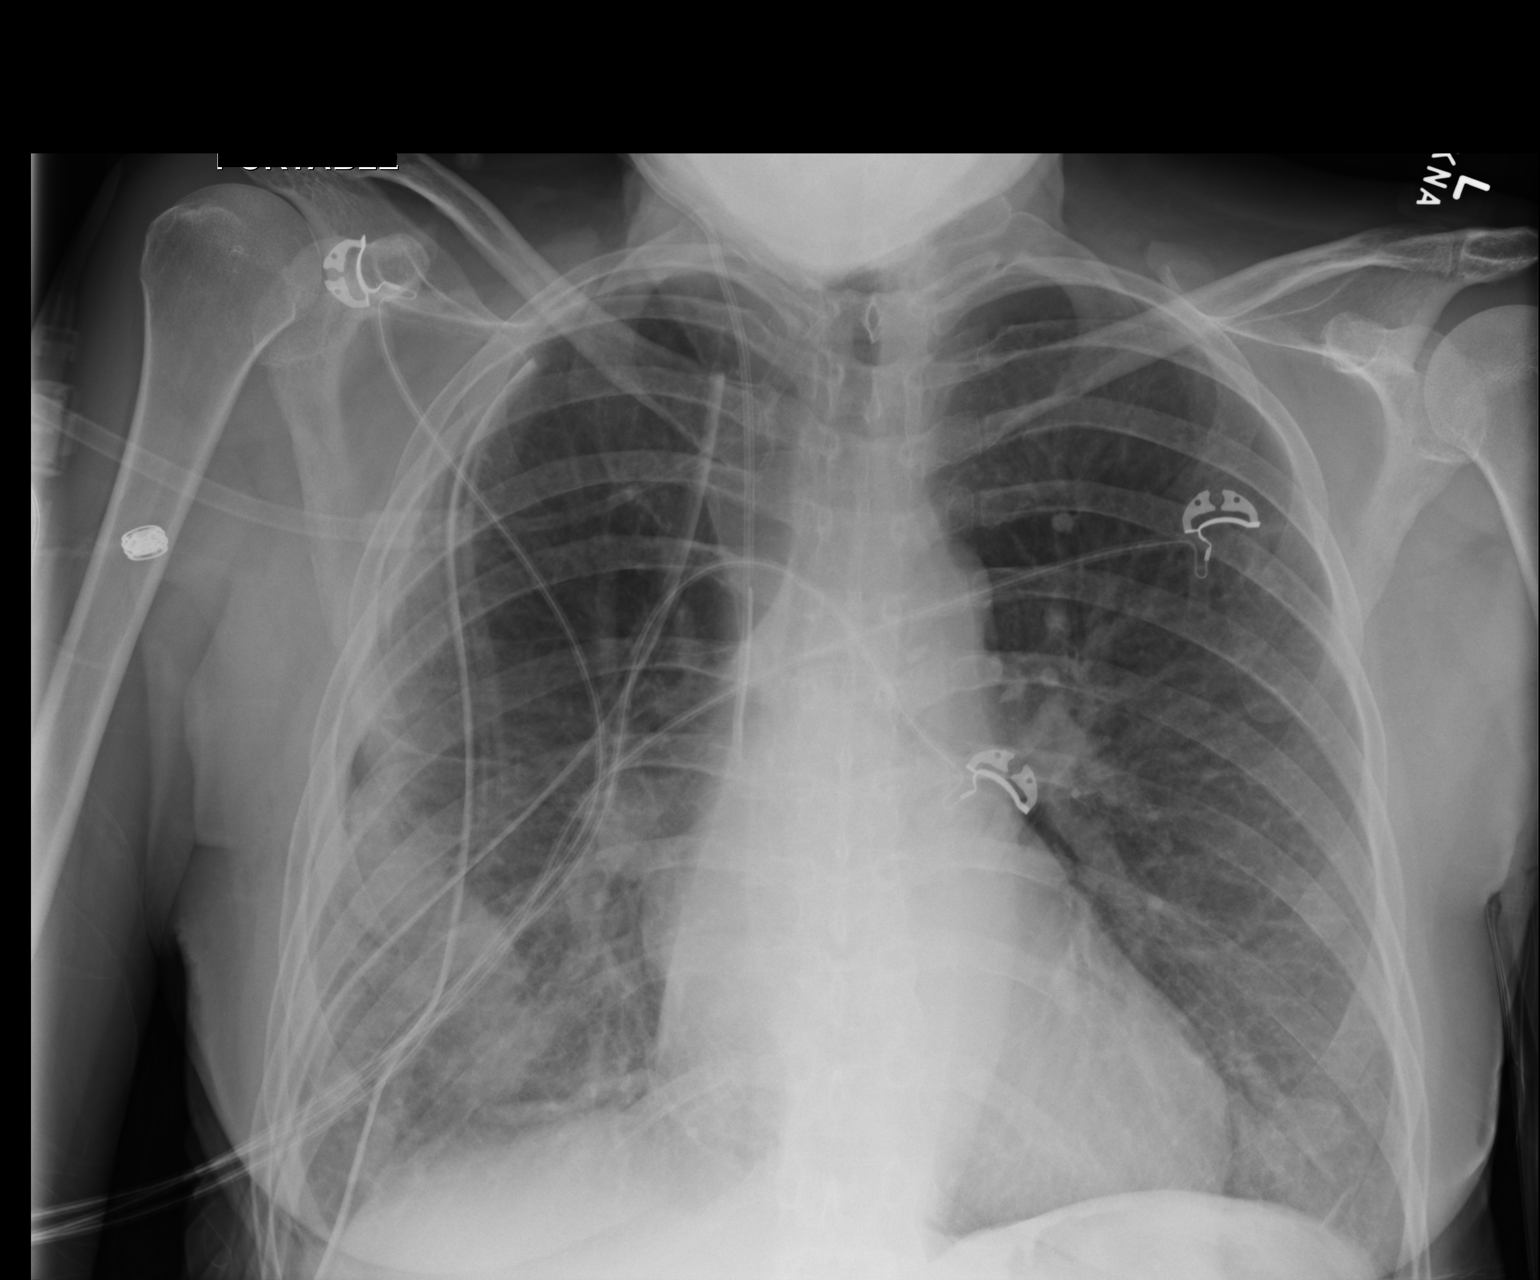

[1 of 1 positions shown; findings below may reference images not displayed]

FINDINGS: Two right apical chest tubes are in place. Stable cardiomediastinal
silhouette with normal heart size. No pneumothorax. Layering
component of right pleural effusion has resolved. Ovoid opacity
overlying the lower right lung. No left pleural effusion. No
pulmonary edema. Improved aeration at the right lung base.
IMPRESSION: 1. Layering component of right pleural effusion has resolved status
post placement of 2 right apical chest tubes. No pneumothorax.
2. Improved aeration at the right lung base. Nonspecific ovoid
opacity overlying the lower right lung, favor small amount of
trapped fluid within fissure. Chest radiograph first chest CT
follow-up advised.

## 2020-08-16 IMAGING — DX DG CHEST 1V PORT
1 series · 1 of 1 positions shown · non-contrast
Comparison: October 03, 2019

CLINICAL DATA: Shortness of breath, chest tube

EXAM:
PORTABLE CHEST 1 VIEW

[chest ap]
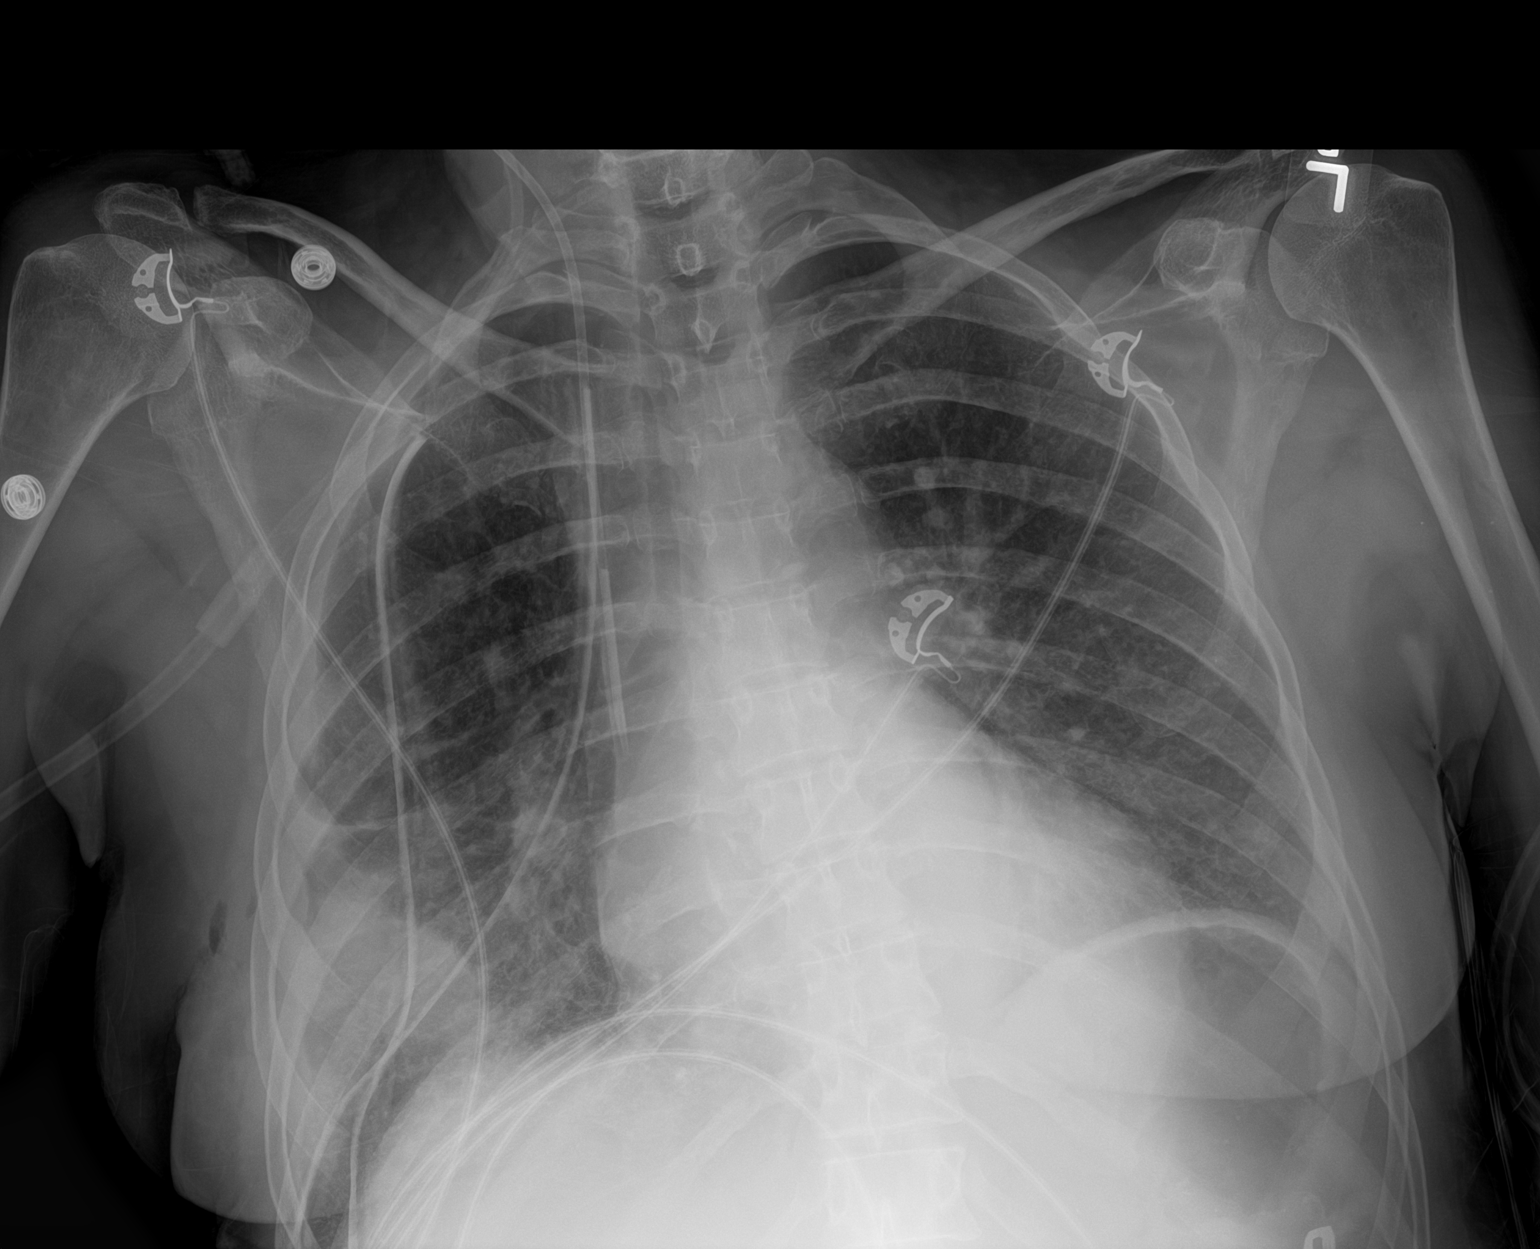

[1 of 1 positions shown; findings below may reference images not displayed]

FINDINGS: There are 2 apically directed chest tube from the right. No
pneumothorax. Pleural fluid is present along the right chest wall.
Opacity at the right lung base remains present. Stable
cardiomediastinal contours.
IMPRESSION: Similar positioning of chest tubes.  No pneumothorax.

Residual pleural effusion tracking along the right chest wall.
Opacity at the right lung base may reflect an area of loculation or
maybe parenchymal.

## 2020-08-17 IMAGING — DX DG CHEST 1V PORT
1 series · 1 of 1 positions shown · non-contrast
Comparison: 10/04/2019

CLINICAL DATA: Evaluate pleural effusion.

EXAM:
PORTABLE CHEST 1 VIEW

[chest ap]
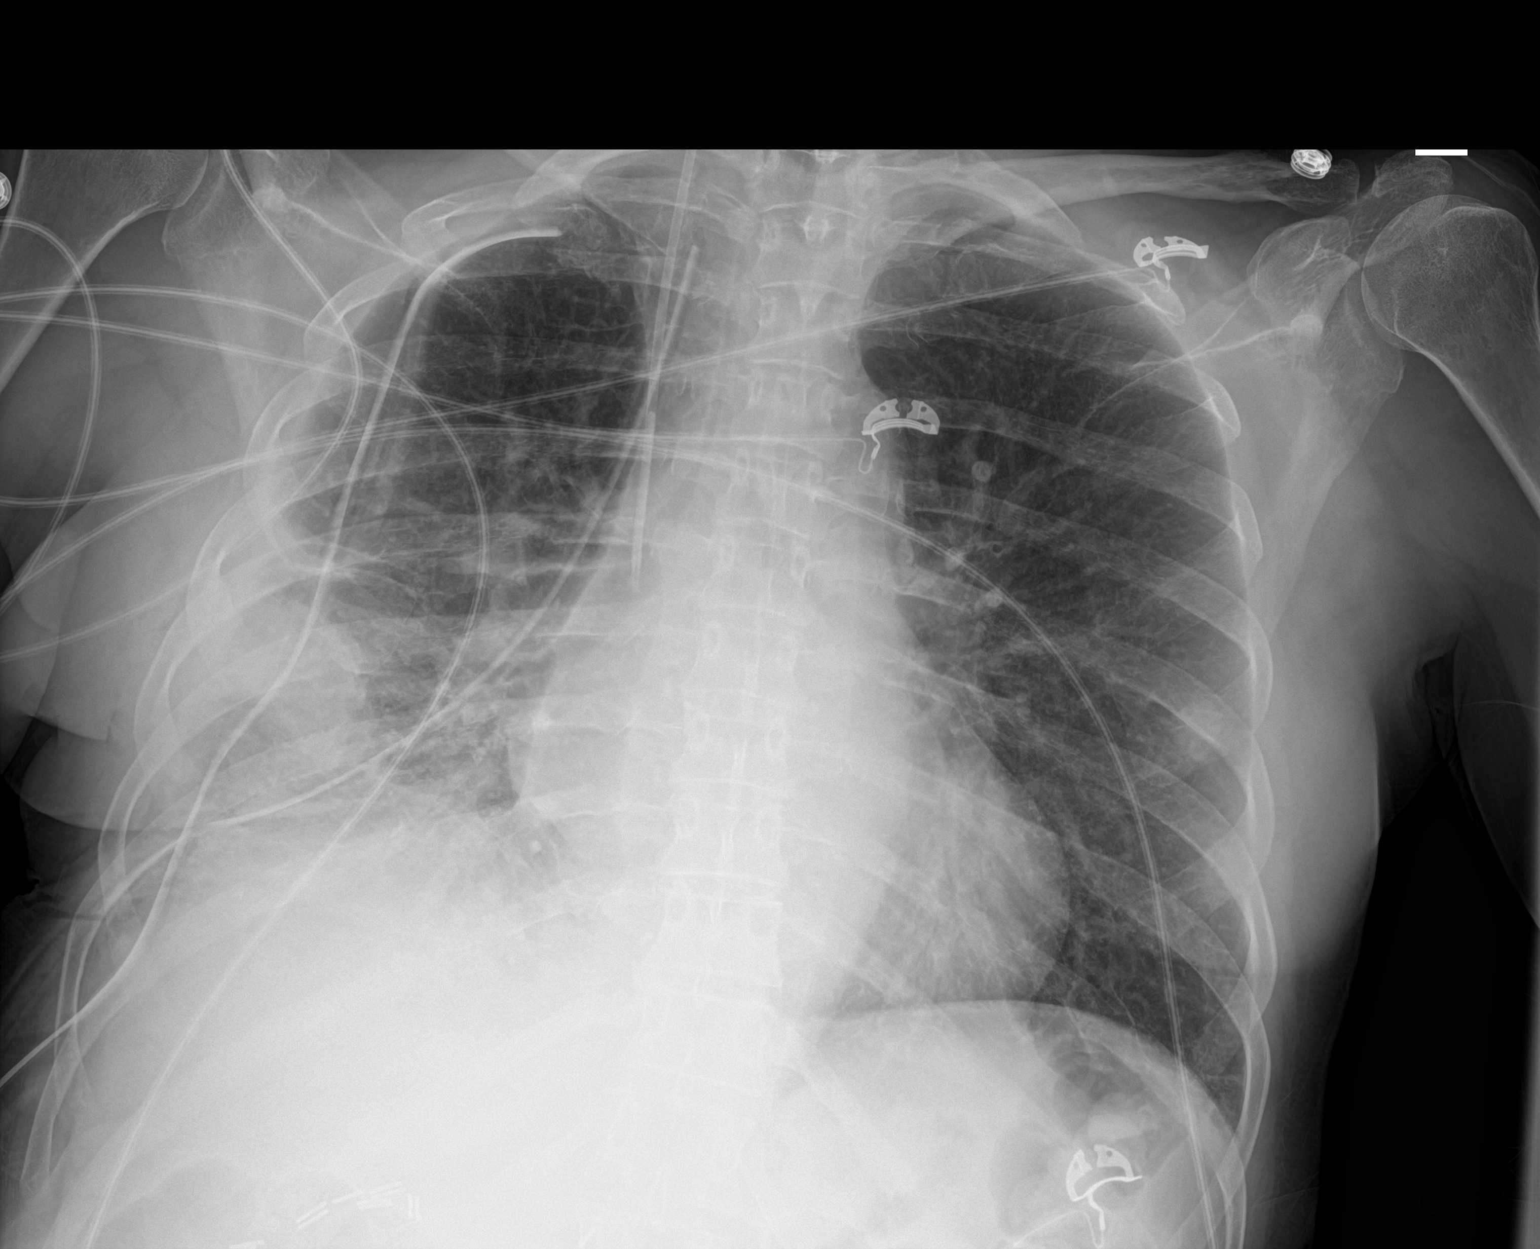

[1 of 1 positions shown; findings below may reference images not displayed]

FINDINGS: Right IJ catheter tip is at the cavoatrial junction. There are 2
right-sided chest tubes. No significant pneumothorax identified.
Decreased right lung volume. Opacity in the right base and right mid
lung are unchanged.
IMPRESSION: 1. No significant change in aeration to the right lung compared with
previous exam.
2. No significant pneumothorax identified after placement of 2
right-sided chest tubes.

## 2020-08-18 IMAGING — DX DG CHEST 1V PORT
1 series · 1 of 1 positions shown · non-contrast
Comparison: 10/05/2019

CLINICAL DATA: Right-sided chest tubes, follow-up study.

EXAM:
PORTABLE CHEST 1 VIEW

[chest ap]
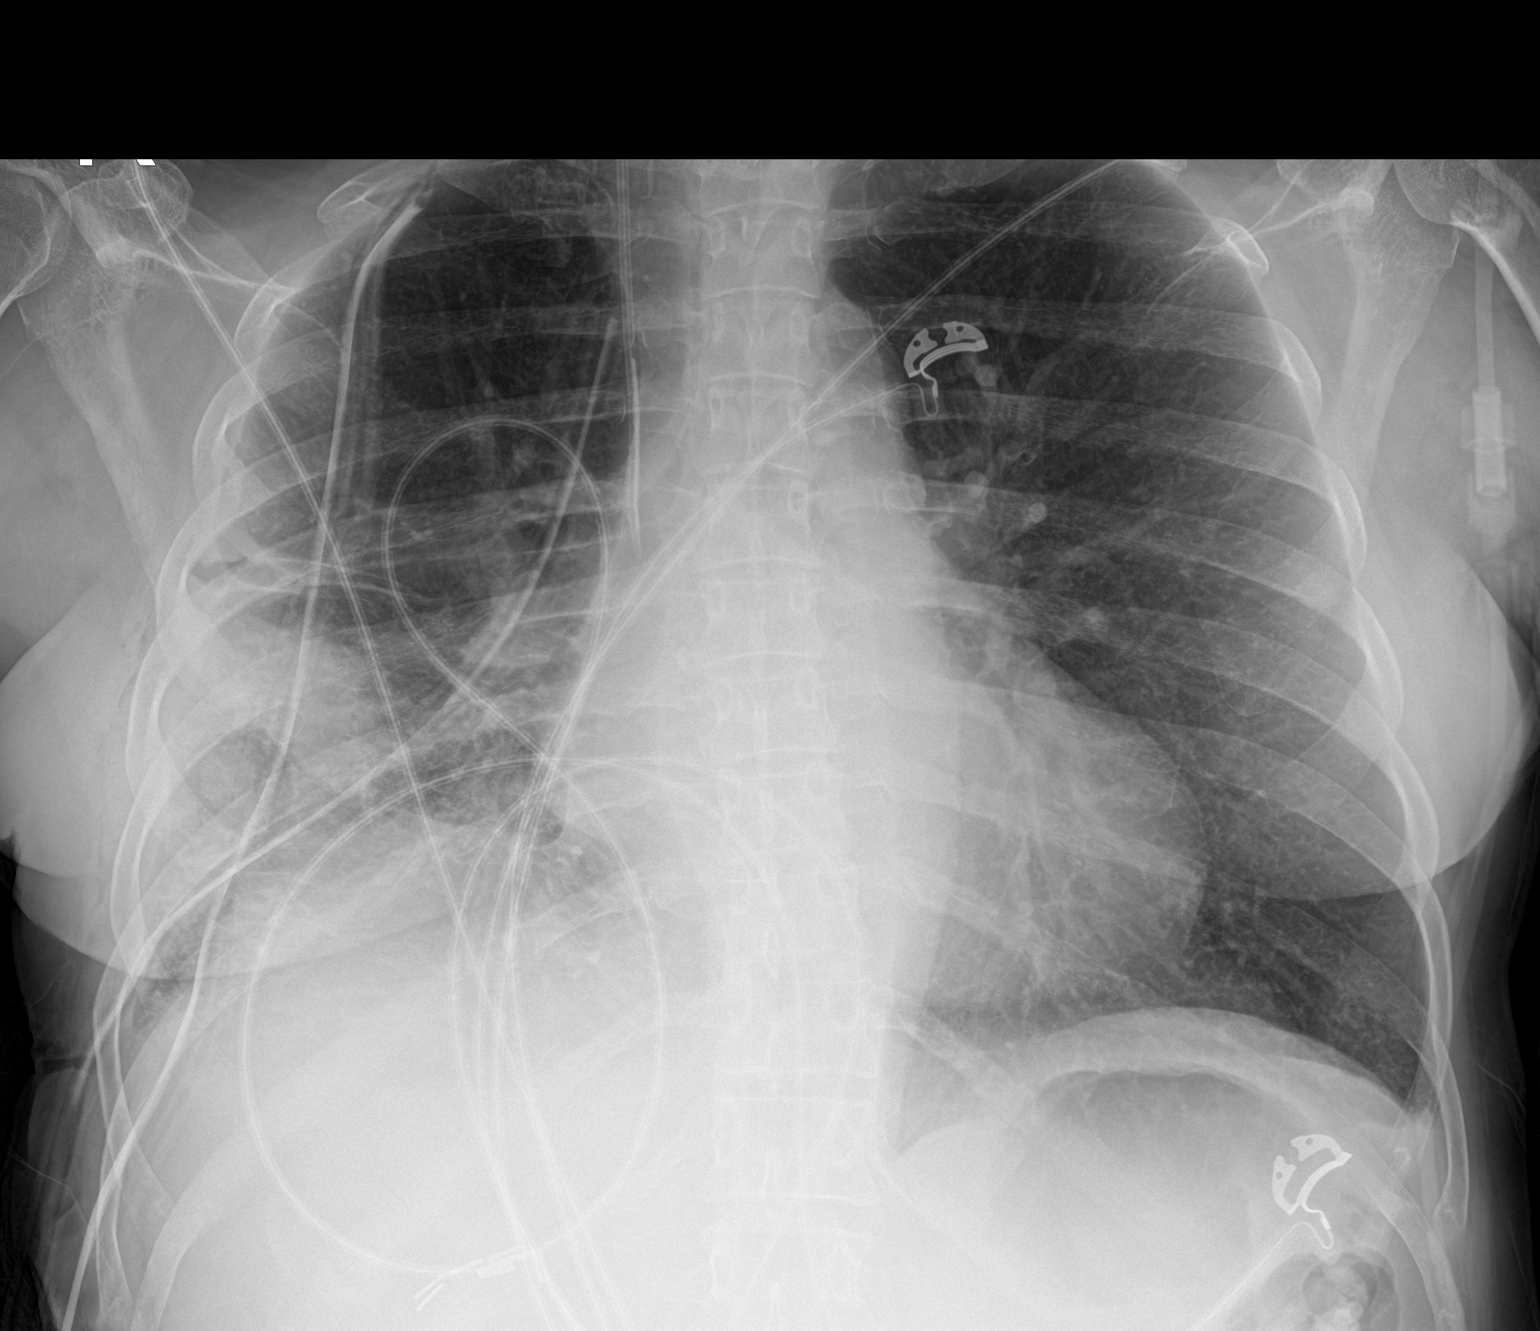

[1 of 1 positions shown; findings below may reference images not displayed]

FINDINGS: Right IJ central venous catheter terminates at the caval to atrial
junction. Cardiomediastinal contours are stable.

Right-sided chest tubes remain in place, slight retraction of the
tube which terminates medially in the chest may have occurred since
the previous study, still appears to be in good position period

Small amount of subcutaneous emphysema along the right chest.

Increased density at the right lung base as before. No visible
pneumothorax. Lungs are otherwise clear. No acute bone finding.
IMPRESSION: 1. Stable appearance of the chest. No visible pneumothorax.
2. Possible slight retraction of the chest tube terminating medially
in the chest, this appears to remain in good position. Some of this
could also be projectional.

## 2020-09-02 DIAGNOSIS — I050 Rheumatic mitral stenosis: Secondary | ICD-10-CM | POA: Diagnosis not present

## 2020-09-02 DIAGNOSIS — Z01810 Encounter for preprocedural cardiovascular examination: Secondary | ICD-10-CM | POA: Diagnosis not present

## 2020-09-07 DIAGNOSIS — M79602 Pain in left arm: Secondary | ICD-10-CM | POA: Diagnosis not present

## 2020-09-07 DIAGNOSIS — Z87891 Personal history of nicotine dependence: Secondary | ICD-10-CM | POA: Diagnosis not present

## 2020-09-07 DIAGNOSIS — G4700 Insomnia, unspecified: Secondary | ICD-10-CM | POA: Diagnosis not present

## 2020-09-07 DIAGNOSIS — I050 Rheumatic mitral stenosis: Secondary | ICD-10-CM | POA: Diagnosis not present

## 2020-09-07 DIAGNOSIS — I9789 Other postprocedural complications and disorders of the circulatory system, not elsewhere classified: Secondary | ICD-10-CM | POA: Diagnosis not present

## 2020-09-07 DIAGNOSIS — R9431 Abnormal electrocardiogram [ECG] [EKG]: Secondary | ICD-10-CM | POA: Diagnosis not present

## 2020-09-07 DIAGNOSIS — D638 Anemia in other chronic diseases classified elsewhere: Secondary | ICD-10-CM | POA: Diagnosis not present

## 2020-09-07 DIAGNOSIS — J9811 Atelectasis: Secondary | ICD-10-CM | POA: Diagnosis not present

## 2020-09-07 DIAGNOSIS — Q211 Atrial septal defect: Secondary | ICD-10-CM | POA: Diagnosis not present

## 2020-09-07 DIAGNOSIS — R570 Cardiogenic shock: Secondary | ICD-10-CM | POA: Diagnosis not present

## 2020-09-07 DIAGNOSIS — J948 Other specified pleural conditions: Secondary | ICD-10-CM | POA: Diagnosis not present

## 2020-09-07 DIAGNOSIS — I7300 Raynaud's syndrome without gangrene: Secondary | ICD-10-CM | POA: Diagnosis not present

## 2020-09-07 DIAGNOSIS — F419 Anxiety disorder, unspecified: Secondary | ICD-10-CM | POA: Diagnosis not present

## 2020-09-07 DIAGNOSIS — J9601 Acute respiratory failure with hypoxia: Secondary | ICD-10-CM | POA: Diagnosis not present

## 2020-09-07 DIAGNOSIS — D72829 Elevated white blood cell count, unspecified: Secondary | ICD-10-CM | POA: Diagnosis not present

## 2020-09-07 DIAGNOSIS — Z7982 Long term (current) use of aspirin: Secondary | ICD-10-CM | POA: Diagnosis not present

## 2020-09-07 DIAGNOSIS — D62 Acute posthemorrhagic anemia: Secondary | ICD-10-CM | POA: Diagnosis not present

## 2020-09-07 DIAGNOSIS — Z8639 Personal history of other endocrine, nutritional and metabolic disease: Secondary | ICD-10-CM | POA: Diagnosis not present

## 2020-09-07 DIAGNOSIS — E1165 Type 2 diabetes mellitus with hyperglycemia: Secondary | ICD-10-CM | POA: Diagnosis not present

## 2020-09-07 DIAGNOSIS — E871 Hypo-osmolality and hyponatremia: Secondary | ICD-10-CM | POA: Diagnosis not present

## 2020-09-07 DIAGNOSIS — Z781 Physical restraint status: Secondary | ICD-10-CM | POA: Diagnosis not present

## 2020-09-07 DIAGNOSIS — R739 Hyperglycemia, unspecified: Secondary | ICD-10-CM | POA: Diagnosis not present

## 2020-09-07 DIAGNOSIS — I342 Nonrheumatic mitral (valve) stenosis: Secondary | ICD-10-CM | POA: Diagnosis not present

## 2020-09-07 DIAGNOSIS — J918 Pleural effusion in other conditions classified elsewhere: Secondary | ICD-10-CM | POA: Diagnosis not present

## 2020-09-07 DIAGNOSIS — J90 Pleural effusion, not elsewhere classified: Secondary | ICD-10-CM | POA: Diagnosis not present

## 2020-09-18 DIAGNOSIS — I050 Rheumatic mitral stenosis: Secondary | ICD-10-CM | POA: Diagnosis not present

## 2020-09-23 DIAGNOSIS — R079 Chest pain, unspecified: Secondary | ICD-10-CM | POA: Diagnosis not present

## 2020-09-23 DIAGNOSIS — R413 Other amnesia: Secondary | ICD-10-CM | POA: Diagnosis not present

## 2020-09-23 DIAGNOSIS — K219 Gastro-esophageal reflux disease without esophagitis: Secondary | ICD-10-CM | POA: Diagnosis not present

## 2020-09-23 DIAGNOSIS — R739 Hyperglycemia, unspecified: Secondary | ICD-10-CM | POA: Diagnosis not present

## 2020-09-23 DIAGNOSIS — R0602 Shortness of breath: Secondary | ICD-10-CM | POA: Diagnosis not present

## 2020-09-23 DIAGNOSIS — E785 Hyperlipidemia, unspecified: Secondary | ICD-10-CM | POA: Diagnosis not present

## 2020-09-23 DIAGNOSIS — D649 Anemia, unspecified: Secondary | ICD-10-CM | POA: Diagnosis not present

## 2020-11-03 DIAGNOSIS — Z03818 Encounter for observation for suspected exposure to other biological agents ruled out: Secondary | ICD-10-CM | POA: Diagnosis not present

## 2020-11-04 DIAGNOSIS — Z03818 Encounter for observation for suspected exposure to other biological agents ruled out: Secondary | ICD-10-CM | POA: Diagnosis not present

## 2020-11-13 ENCOUNTER — Ambulatory Visit: Payer: BC Managed Care – PPO | Admitting: Internal Medicine

## 2020-12-09 ENCOUNTER — Encounter: Payer: Self-pay | Admitting: Internal Medicine

## 2021-01-07 ENCOUNTER — Encounter: Payer: Self-pay | Admitting: Internal Medicine

## 2021-01-13 ENCOUNTER — Encounter: Payer: Self-pay | Admitting: Nurse Practitioner

## 2021-01-13 ENCOUNTER — Ambulatory Visit (INDEPENDENT_AMBULATORY_CARE_PROVIDER_SITE_OTHER): Payer: BC Managed Care – PPO | Admitting: Nurse Practitioner

## 2021-01-13 ENCOUNTER — Other Ambulatory Visit: Payer: Self-pay

## 2021-01-13 VITALS — BP 120/66 | HR 112 | Resp 14 | Ht 65.0 in | Wt 141.0 lb

## 2021-01-13 DIAGNOSIS — N750 Cyst of Bartholin's gland: Secondary | ICD-10-CM

## 2021-01-13 NOTE — Progress Notes (Signed)
   Acute Office Visit  Subjective:    Patient ID: Denise Benitez, female    DOB: August 04, 1966, 55 y.o.   MRN: [id number]   HPI 55 y.o. presents today for vaginal cyst that she noticed 1 week ago that is increasing in size. Mild pain without drainage. She reports having bartholin cyst drained years ago on opposite side without recurrence.  Review of Systems  Constitutional: Negative.   Genitourinary: Positive for vaginal pain.  Skin:       Vaginal cyst       Objective:    Physical Exam Constitutional:      Appearance: Normal appearance.  Genitourinary:      BP 120/66 (BP Location: Right Arm, Patient Position: Sitting, Cuff Size: Normal)   Pulse (!) 112   Resp 14   Ht 5\' 5"  (1.651 m)   Wt 141 lb (64 kg)   LMP 12/01/2016   BMI 23.46 kg/m  Wt Readings from Last 3 Encounters:  01/13/21 141 lb (64 kg)  07/13/20 148 lb 4 oz (67.2 kg)  03/24/20 140 lb 8 oz (63.7 kg)        Assessment & Plan:   Problem List Items Addressed This Visit   None   Visit Diagnoses    Cyst of left Bartholin's gland duct    -  Primary     Plan: Discussed risks and benefits of the procedure, as well as the alternatives. Verbal consent obtained from patient.  Incision & drainage performed of left bartholin cyst. The area was prepped, cleaned with betadine, and locally anesthetized with 1% xylocaine. #11 scalpel used to make surgical incision over the most fluctuant area. I then expressed any fluid that I could. Drainage thin and light yellow to orange.  It appeared that all fluid was drained. Area cleaned and no dressing required. Pad provided for any leftover drainage. Recommended sitz baths and keeping area clean and dry. She is aware that sometimes bartholin cysts can return and if this happens we discussed treatment options to include repeating I & D, placing catheter, or marsupialization. She is agreeable to plan.        Tamela Gammon DNP, 8:57 AM 01/13/2021

## 2021-01-13 NOTE — Patient Instructions (Signed)
Quiste de [employer] Bartholin's Cyst  El quiste de Bartolino es un saco lleno de lquido que se forma como resultado de una obstruccin a lo largo del tubo (conducto) de la glndula de [place]. Las glndulas de Bartolino son pequeas glndulas ubicadas en los pliegues de la piel alrededor del orificio de la vagina (labios de la vulva). Estas glndulas producen un lquido que humedece o lubrica la parte externa de la vagina durante las relaciones sexuales. Un quiste que no es grande ni est infectado puede no causar problemas ni requerir Clinical research associate. Si el quiste se infecta con bacterias, se denomina absceso de Bartolino. Un absceso puede causar sntomas como dolor e hinchazn y es ms probable que requiera tratamiento. Cules son las causas? Esta afeccin puede producirse por un conducto de la glndula de Bartolino obstruido. Estos conductos pueden obstruirse debido a la acumulacin natural de lquidos y Microbiologist. La presencia de bacterias dentro del quiste puede causar una infeccin. En muchos de [employer], se desconoce la causa. Cules son los signos o sntomas? Entre los sntomas, se pueden incluir los siguientes:  Un bulto o una hinchazn en los labios de la vulva, cerca del orificio inferior de la vagina.  Molestias o dolor. Esto puede empeorar al [employer] sexuales o al caminar.  Enrojecimiento, hinchazn y secrecin de lquido en el rea. Estos podran ser signos de un absceso. La gravedad de los sntomas depende del tamao del quiste y si est infectado. La infeccin agrava los sntomas. Cmo se diagnostica? Esta afeccin se puede diagnosticar en funcin de lo siguiente:  Los sntomas y los antecedentes mdicos.  Un examen fsico para detectar hinchazn en el rea vaginal. Es posible que deba acostarse boca arriba en una camilla con los pies en los soportes para que Hotel manager.  Anlisis de sangre para verificar si hay infecciones.  Extraccin de [place] de  lquido del quiste o absceso (biopsia) para Geophysical data processor. Es posible que trabaje con un mdico especializado en [place] (gineclogo) para recibir un diagnstico y Clinical research associate. Cmo se trata? Si el quiste es pequeo, no est infectado y no causa sntomas, tal vez no sea necesario Building services engineer. Estos quistes a menudo desaparecen por s solos, con cuidados en el hogar como baos calientes o compresas tibias. Si tiene un quiste o absceso grande, el tratamiento puede incluir lo siguiente:  Antibiticos.  Un procedimiento para drenar el lquido que se encuentra en el interior del quiste o absceso. Estos procedimientos involucran una incisin en el quiste o absceso para drenar el lquido y luego puede realizarse alguno de los siguientes procedimientos: ? Puede colocarse un pequeo tubo delgado (catter) dentro del quiste o absceso para evitar que se cierre y vuelva a llenarse de lquido (fistulizacin). El catter se extraer en la visita de seguimiento. ? Los bordes de la incisin pueden suturarse con puntos en la piel para que el quiste o absceso se mantenga abierto ([place]). Esto permite que siga drenando y no se vuelva a llenar de lquido. Si tiene quistes o abscesos que vuelven a formarse (recurrentes) y han requerido incisin y Advertising account executive veces, el mdico puede plantearle la posibilidad de Ardelia Mems ciruga para extirpar la glndula de [place]. Siga estas instrucciones en su casa: Medicamentos  Use los medicamentos de venta libre y los recetados solamente como se lo haya indicado el mdico.  Si le recetaron un antibitico, tmelo como se lo haya indicado el mdico. No deje de tomar o usar el antibitico  aunque la afeccin mejore. Control del dolor y de la hinchazn  Pruebe con un bao de asiento para Therapist, occupational y la hinchazn. Un bao de asiento es un bao con agua tibia en el cual el agua solo le llega hasta la cadera y debe cubrir las nalgas.  Puede tomar baos de asiento varias veces al [employer].  Aplique calor en la zona afectada con la frecuencia que sea necesaria. Use la fuente de calor que el mdico le recomiende, como una compresa de calor hmedo o una almohadilla trmica. ? Coloque una [employer] piel y la fuente de Freight forwarder. ? Aplique calor durante 20 a 72minutos. ? Retire la fuente de calor si la piel se pone de color rojo brillante. Esto es especialmente importante si no puede sentir dolor, calor o fro. Puede correr un riesgo mayor de sufrir quemaduras. No se quede dormida con la almohadilla trmica colocada. Indicaciones generales  Si se dren el quiste o absceso, siga las indicaciones del mdico acerca del cuidado de la herida. Utilice toallas femeninas cuando lo necesite para absorber cualquier secrecin.  No apriete ni haga presin [employer].  No tenga relaciones sexuales hasta que el quiste haya desaparecido o la herida del drenaje haya sanado.  Tome estas medidas para ayudar a prevenir que vuelva a formarse un quiste de Engineer, manufacturing y que se desarrollen otros quistes de Bartolino: ? Tome un bao o una ducha una [place]. Higienice el rea vaginal con agua y un jabn suave cuando se bae. ? Practique el sexo seguro para prevenir las ITS. Hable con el mdico sobre cmo prevenir las ITS y qu formas de mtodos de control de la natalidad (anticonceptivos) pueden ser mejores para usted.  Cumpla con todas las visitas de seguimiento. Esto es importante. Comunquese con un mdico si:  Tiene fiebre.  Tiene ms enrojecimiento, hinchazn o dolor alrededor del quiste.  Tiene lquido, sangre, pus o mal olor que proviene del [place].  Tiene un quiste que [place] de tamao o vuelve a formarse. Resumen  El quiste de Bartolino es un saco lleno de lquido que se forma como resultado de una obstruccin a lo largo del conducto de la glndula de [place].  Si el quiste es pequeo, no est infectado y no causa sntomas, tal vez  no sea necesario Building services engineer.  Si tiene un quiste o absceso grande, es posible que el mdico realice un procedimiento para Musician lquido.  Si tiene quistes o abscesos que vuelven a formarse (recurrentes) y han requerido incisin y Advertising account executive veces, el mdico puede plantearle la posibilidad de Ardelia Mems ciruga para extirpar la glndula de [place]. Esta informacin no tiene Marine scientist el consejo del mdico. Asegrese de hacerle al mdico cualquier pregunta que tenga. Document Revised: 05/08/2020 Document Reviewed: 05/08/2020 Elsevier Patient Education  [place].

## 2021-03-05 ENCOUNTER — Other Ambulatory Visit: Payer: Self-pay

## 2021-03-05 DIAGNOSIS — Z23 Encounter for immunization: Secondary | ICD-10-CM | POA: Diagnosis not present

## 2021-03-05 DIAGNOSIS — W19XXXA Unspecified fall, initial encounter: Secondary | ICD-10-CM | POA: Diagnosis not present

## 2021-03-05 DIAGNOSIS — F10129 Alcohol abuse with intoxication, unspecified: Secondary | ICD-10-CM | POA: Insufficient documentation

## 2021-03-05 DIAGNOSIS — Z7982 Long term (current) use of aspirin: Secondary | ICD-10-CM | POA: Insufficient documentation

## 2021-03-05 DIAGNOSIS — J45909 Unspecified asthma, uncomplicated: Secondary | ICD-10-CM | POA: Diagnosis not present

## 2021-03-05 DIAGNOSIS — Z87891 Personal history of nicotine dependence: Secondary | ICD-10-CM | POA: Insufficient documentation

## 2021-03-05 DIAGNOSIS — S0993XA Unspecified injury of face, initial encounter: Secondary | ICD-10-CM | POA: Diagnosis not present

## 2021-03-05 DIAGNOSIS — S0101XA Laceration without foreign body of scalp, initial encounter: Secondary | ICD-10-CM | POA: Insufficient documentation

## 2021-03-05 DIAGNOSIS — S0181XA Laceration without foreign body of other part of head, initial encounter: Secondary | ICD-10-CM | POA: Diagnosis not present

## 2021-03-05 DIAGNOSIS — S0990XA Unspecified injury of head, initial encounter: Secondary | ICD-10-CM | POA: Diagnosis not present

## 2021-03-05 DIAGNOSIS — Y907 Blood alcohol level of 200-239 mg/100 ml: Secondary | ICD-10-CM | POA: Diagnosis not present

## 2021-03-05 DIAGNOSIS — S5012XA Contusion of left forearm, initial encounter: Secondary | ICD-10-CM | POA: Diagnosis not present

## 2021-03-05 DIAGNOSIS — E039 Hypothyroidism, unspecified: Secondary | ICD-10-CM | POA: Insufficient documentation

## 2021-03-05 DIAGNOSIS — M47812 Spondylosis without myelopathy or radiculopathy, cervical region: Secondary | ICD-10-CM | POA: Diagnosis not present

## 2021-03-05 DIAGNOSIS — Y92009 Unspecified place in unspecified non-institutional (private) residence as the place of occurrence of the external cause: Secondary | ICD-10-CM | POA: Insufficient documentation

## 2021-03-05 DIAGNOSIS — J3489 Other specified disorders of nose and nasal sinuses: Secondary | ICD-10-CM | POA: Diagnosis not present

## 2021-03-05 DIAGNOSIS — S0003XA Contusion of scalp, initial encounter: Secondary | ICD-10-CM | POA: Diagnosis not present

## 2021-03-05 DIAGNOSIS — S199XXA Unspecified injury of neck, initial encounter: Secondary | ICD-10-CM | POA: Diagnosis not present

## 2021-03-06 ENCOUNTER — Emergency Department (HOSPITAL_COMMUNITY): Payer: BC Managed Care – PPO

## 2021-03-06 ENCOUNTER — Emergency Department (HOSPITAL_COMMUNITY)
Admission: EM | Admit: 2021-03-06 | Discharge: 2021-03-06 | Disposition: A | Discharge status: Home / Self Care | Payer: BC Managed Care – PPO | Attending: Emergency Medicine | Admitting: Emergency Medicine

## 2021-03-06 ENCOUNTER — Encounter (HOSPITAL_COMMUNITY): Payer: Self-pay | Admitting: Emergency Medicine

## 2021-03-06 DIAGNOSIS — S5012XA Contusion of left forearm, initial encounter: Secondary | ICD-10-CM

## 2021-03-06 DIAGNOSIS — M47812 Spondylosis without myelopathy or radiculopathy, cervical region: Secondary | ICD-10-CM | POA: Diagnosis not present

## 2021-03-06 DIAGNOSIS — J3489 Other specified disorders of nose and nasal sinuses: Secondary | ICD-10-CM | POA: Diagnosis not present

## 2021-03-06 DIAGNOSIS — S0003XA Contusion of scalp, initial encounter: Secondary | ICD-10-CM

## 2021-03-06 DIAGNOSIS — S0993XA Unspecified injury of face, initial encounter: Secondary | ICD-10-CM | POA: Diagnosis not present

## 2021-03-06 DIAGNOSIS — W19XXXA Unspecified fall, initial encounter: Secondary | ICD-10-CM

## 2021-03-06 DIAGNOSIS — S199XXA Unspecified injury of neck, initial encounter: Secondary | ICD-10-CM | POA: Diagnosis not present

## 2021-03-06 DIAGNOSIS — F10920 Alcohol use, unspecified with intoxication, uncomplicated: Secondary | ICD-10-CM

## 2021-03-06 DIAGNOSIS — S0181XA Laceration without foreign body of other part of head, initial encounter: Secondary | ICD-10-CM | POA: Diagnosis not present

## 2021-03-06 DIAGNOSIS — S0990XA Unspecified injury of head, initial encounter: Secondary | ICD-10-CM

## 2021-03-06 DIAGNOSIS — S0101XA Laceration without foreign body of scalp, initial encounter: Secondary | ICD-10-CM

## 2021-03-06 LAB — CBC WITH DIFFERENTIAL/PLATELET
Abs Immature Granulocytes: 0.04 10*3/uL (ref 0.00–0.07)
Basophils Absolute: 0.1 10*3/uL (ref 0.0–0.1)
Basophils Relative: 1 %
Eosinophils Absolute: 0.5 10*3/uL (ref 0.0–0.5)
Eosinophils Relative: 6 %
HCT: 41.3 % (ref 36.0–46.0)
Hemoglobin: 13.2 g/dL (ref 12.0–15.0)
Immature Granulocytes: 1 %
Lymphocytes Relative: 25 %
Lymphs Abs: 2.2 10*3/uL (ref 0.7–4.0)
MCH: 29.5 pg (ref 26.0–34.0)
MCHC: 32 g/dL (ref 30.0–36.0)
MCV: 92.4 fL (ref 80.0–100.0)
Monocytes Absolute: 0.7 10*3/uL (ref 0.1–1.0)
Monocytes Relative: 8 %
Neutro Abs: 5.2 10*3/uL (ref 1.7–7.7)
Neutrophils Relative %: 59 %
Platelets: 308 10*3/uL (ref 150–400)
RBC: 4.47 MIL/uL (ref 3.87–5.11)
RDW: 13.6 % (ref 11.5–15.5)
WBC: 8.8 10*3/uL (ref 4.0–10.5)
nRBC: 0 % (ref 0.0–0.2)

## 2021-03-06 LAB — BASIC METABOLIC PANEL
Anion gap: 10 (ref 5–15)
BUN: 20 mg/dL (ref 6–20)
CO2: 20 mmol/L — ABNORMAL LOW (ref 22–32)
Calcium: 9.3 mg/dL (ref 8.9–10.3)
Chloride: 103 mmol/L (ref 98–111)
Creatinine, Ser: 0.89 mg/dL (ref 0.44–1.00)
GFR, Estimated: >60 mL/min (ref >60)
Glucose, Bld: 96 mg/dL (ref 70–99)
Potassium: 3.8 mmol/L (ref 3.5–5.1)
Sodium: 133 mmol/L — ABNORMAL LOW (ref 135–145)

## 2021-03-06 LAB — ETHANOL: Alcohol, Ethyl (B): 222 mg/dL — ABNORMAL HIGH (ref <10)

## 2021-03-06 MED ORDER — KETOROLAC TROMETHAMINE 15 MG/ML IJ SOLN
15.0000 mg | Freq: Once | INTRAMUSCULAR | Status: AC
  Administered 2021-03-06: 15 mg via INTRAVENOUS
  Filled 2021-03-06: qty 1

## 2021-03-06 MED ORDER — LIDOCAINE-EPINEPHRINE (PF) 2 %-1:200000 IJ SOLN
INTRAMUSCULAR | Status: AC
  Administered 2021-03-06: 20 mL
  Filled 2021-03-06: qty 20

## 2021-03-06 MED ORDER — LIDOCAINE-EPINEPHRINE 2 %-1:100000 IJ SOLN: 20.0000 mL | Freq: Once | INTRAMUSCULAR | Status: DC

## 2021-03-06 MED ORDER — SODIUM CHLORIDE 0.9 % IV SOLN
1.0000 g | Freq: Once | INTRAVENOUS | Status: AC
  Administered 2021-03-06: 1 g via INTRAVENOUS
  Filled 2021-03-06: qty 10

## 2021-03-06 MED ORDER — TETANUS-DIPHTH-ACELL PERTUSSIS 5-2.5-18.5 LF-MCG/0.5 IM SUSY
0.5000 mL | PREFILLED_SYRINGE | Freq: Once | INTRAMUSCULAR | Status: AC
  Administered 2021-03-06: 0.5 mL via INTRAMUSCULAR
  Filled 2021-03-06: qty 0.5
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
## 2021-04-06 ENCOUNTER — Ambulatory Visit: Payer: BC Managed Care – PPO | Admitting: Nurse Practitioner

## 2021-04-15 DIAGNOSIS — F411 Generalized anxiety disorder: Secondary | ICD-10-CM | POA: Diagnosis not present

## 2021-05-11 ENCOUNTER — Other Ambulatory Visit: Payer: Self-pay | Admitting: Family Medicine

## 2021-05-11 DIAGNOSIS — Z1231 Encounter for screening mammogram for malignant neoplasm of breast: Secondary | ICD-10-CM

## 2021-05-12 ENCOUNTER — Ambulatory Visit: Payer: BC Managed Care – PPO | Admitting: Obstetrics & Gynecology

## 2021-05-12 DIAGNOSIS — Z0000 Encounter for general adult medical examination without abnormal findings: Secondary | ICD-10-CM | POA: Diagnosis not present

## 2021-05-12 DIAGNOSIS — Z1231 Encounter for screening mammogram for malignant neoplasm of breast: Secondary | ICD-10-CM | POA: Diagnosis not present

## 2021-05-12 DIAGNOSIS — Z13220 Encounter for screening for lipoid disorders: Secondary | ICD-10-CM | POA: Diagnosis not present

## 2021-05-12 DIAGNOSIS — Z23 Encounter for immunization: Secondary | ICD-10-CM | POA: Diagnosis not present

## 2021-05-12 DIAGNOSIS — Z1159 Encounter for screening for other viral diseases: Secondary | ICD-10-CM | POA: Diagnosis not present

## 2021-05-12 DIAGNOSIS — R079 Chest pain, unspecified: Secondary | ICD-10-CM | POA: Diagnosis not present

## 2021-05-12 DIAGNOSIS — D649 Anemia, unspecified: Secondary | ICD-10-CM | POA: Diagnosis not present

## 2021-05-12 DIAGNOSIS — E519 Thiamine deficiency, unspecified: Secondary | ICD-10-CM | POA: Diagnosis not present

## 2021-05-12 DIAGNOSIS — I83811 Varicose veins of right lower extremities with pain: Secondary | ICD-10-CM | POA: Diagnosis not present

## 2021-05-12 DIAGNOSIS — Z131 Encounter for screening for diabetes mellitus: Secondary | ICD-10-CM | POA: Diagnosis not present

## 2021-05-12 DIAGNOSIS — R001 Bradycardia, unspecified: Secondary | ICD-10-CM | POA: Diagnosis not present

## 2021-06-17 DIAGNOSIS — Z952 Presence of prosthetic heart valve: Secondary | ICD-10-CM | POA: Diagnosis not present

## 2021-06-24 DIAGNOSIS — I052 Rheumatic mitral stenosis with insufficiency: Secondary | ICD-10-CM | POA: Diagnosis not present

## 2021-07-02 ENCOUNTER — Ambulatory Visit: Payer: BC Managed Care – PPO

## 2021-07-29 DIAGNOSIS — I959 Hypotension, unspecified: Secondary | ICD-10-CM | POA: Diagnosis not present

## 2021-07-29 DIAGNOSIS — M25511 Pain in right shoulder: Secondary | ICD-10-CM | POA: Diagnosis not present

## 2021-07-29 DIAGNOSIS — G4700 Insomnia, unspecified: Secondary | ICD-10-CM | POA: Diagnosis not present

## 2021-08-04 DIAGNOSIS — M24811 Other specific joint derangements of right shoulder, not elsewhere classified: Secondary | ICD-10-CM | POA: Diagnosis not present

## 2021-08-09 ENCOUNTER — Ambulatory Visit
Admission: RE | Admit: 2021-08-09 | Discharge: 2021-08-09 | Disposition: A | Discharge status: Home / Self Care | Payer: BC Managed Care – PPO | Source: Ambulatory Visit | Attending: Family Medicine | Admitting: Family Medicine

## 2021-08-09 ENCOUNTER — Other Ambulatory Visit: Payer: Self-pay

## 2021-08-09 DIAGNOSIS — Z1231 Encounter for screening mammogram for malignant neoplasm of breast: Secondary | ICD-10-CM

## 2021-09-29 DIAGNOSIS — M24811 Other specific joint derangements of right shoulder, not elsewhere classified: Secondary | ICD-10-CM | POA: Diagnosis not present

## 2022-03-29 ENCOUNTER — Ambulatory Visit: Payer: BC Managed Care – PPO

## 2022-04-05 ENCOUNTER — Ambulatory Visit
Admission: RE | Admit: 2022-04-05 | Discharge: 2022-04-05 | Disposition: A | Discharge status: Home / Self Care | Payer: BC Managed Care – PPO | Source: Ambulatory Visit | Attending: Family Medicine | Admitting: Family Medicine

## 2022-04-05 DIAGNOSIS — Z1231 Encounter for screening mammogram for malignant neoplasm of breast: Secondary | ICD-10-CM

## 2022-04-06 ENCOUNTER — Other Ambulatory Visit: Payer: Self-pay | Admitting: Family Medicine

## 2022-04-06 DIAGNOSIS — N644 Mastodynia: Secondary | ICD-10-CM

## 2022-04-06 DIAGNOSIS — M79622 Pain in left upper arm: Secondary | ICD-10-CM

## 2022-04-27 ENCOUNTER — Ambulatory Visit
Admission: RE | Admit: 2022-04-27 | Discharge: 2022-04-27 | Disposition: A | Discharge status: Home / Self Care | Payer: BC Managed Care – PPO | Source: Ambulatory Visit | Attending: Family Medicine | Admitting: Family Medicine

## 2022-04-27 ENCOUNTER — Other Ambulatory Visit: Payer: Self-pay | Admitting: Family Medicine

## 2022-04-27 DIAGNOSIS — M79622 Pain in left upper arm: Secondary | ICD-10-CM

## 2022-04-27 DIAGNOSIS — N644 Mastodynia: Secondary | ICD-10-CM

## 2022-04-27 DIAGNOSIS — R590 Localized enlarged lymph nodes: Secondary | ICD-10-CM

## 2022-04-27 DIAGNOSIS — R2231 Localized swelling, mass and lump, right upper limb: Secondary | ICD-10-CM

## 2022-05-04 ENCOUNTER — Ambulatory Visit
Admission: RE | Admit: 2022-05-04 | Discharge: 2022-05-04 | Disposition: A | Discharge status: Home / Self Care | Payer: BC Managed Care – PPO | Source: Ambulatory Visit | Attending: Family Medicine | Admitting: Family Medicine

## 2022-05-04 ENCOUNTER — Other Ambulatory Visit: Payer: Self-pay | Admitting: Family Medicine

## 2022-05-04 ENCOUNTER — Other Ambulatory Visit (HOSPITAL_COMMUNITY)
Admission: RE | Admit: 2022-05-04 | Discharge: 2022-05-04 | Disposition: A | Discharge status: Home / Self Care | Payer: BC Managed Care – PPO | Source: Ambulatory Visit | Attending: Family Medicine | Admitting: Family Medicine

## 2022-05-04 DIAGNOSIS — R2231 Localized swelling, mass and lump, right upper limb: Secondary | ICD-10-CM

## 2022-05-05 LAB — SURGICAL PATHOLOGY
